# Patient Record
Sex: Female | Born: 1975 | Race: Black or African American | Hispanic: No | Smoking: Former smoker
Health system: Southern US, Community
[De-identification: ages and names within clinical notes are randomized; demographics above are authoritative.]

## PROBLEM LIST (undated history)

## (undated) DIAGNOSIS — F32A Depression, unspecified: Secondary | ICD-10-CM

## (undated) DIAGNOSIS — F329 Major depressive disorder, single episode, unspecified: Secondary | ICD-10-CM

## (undated) DIAGNOSIS — J45909 Unspecified asthma, uncomplicated: Secondary | ICD-10-CM

## (undated) DIAGNOSIS — I10 Essential (primary) hypertension: Secondary | ICD-10-CM

## (undated) DIAGNOSIS — E119 Type 2 diabetes mellitus without complications: Secondary | ICD-10-CM

## (undated) HISTORY — PX: SINUS EXPLORATION: SHX5214

## (undated) HISTORY — PX: KNEE ARTHROSCOPY: SUR90

## (undated) HISTORY — PX: CHOLECYSTECTOMY: SHX55

## (undated) HISTORY — PX: HERNIA REPAIR: SHX51

## (undated) HISTORY — PX: ANKLE ARTHROSCOPY: SUR85

---

## 2002-01-19 ENCOUNTER — Emergency Department (HOSPITAL_COMMUNITY): Admission: EM | Admit: 2002-01-19 | Discharge: 2002-01-19 | Payer: Self-pay | Admitting: Emergency Medicine

## 2012-06-22 ENCOUNTER — Encounter (HOSPITAL_COMMUNITY): Payer: Self-pay | Admitting: Emergency Medicine

## 2012-06-22 DIAGNOSIS — I10 Essential (primary) hypertension: Secondary | ICD-10-CM | POA: Insufficient documentation

## 2012-06-22 DIAGNOSIS — Z7982 Long term (current) use of aspirin: Secondary | ICD-10-CM | POA: Insufficient documentation

## 2012-06-22 DIAGNOSIS — Z881 Allergy status to other antibiotic agents status: Secondary | ICD-10-CM | POA: Insufficient documentation

## 2012-06-22 DIAGNOSIS — Z880 Allergy status to penicillin: Secondary | ICD-10-CM | POA: Insufficient documentation

## 2012-06-22 DIAGNOSIS — F3289 Other specified depressive episodes: Secondary | ICD-10-CM | POA: Insufficient documentation

## 2012-06-22 DIAGNOSIS — IMO0002 Reserved for concepts with insufficient information to code with codable children: Secondary | ICD-10-CM | POA: Insufficient documentation

## 2012-06-22 DIAGNOSIS — Z885 Allergy status to narcotic agent status: Secondary | ICD-10-CM | POA: Insufficient documentation

## 2012-06-22 DIAGNOSIS — Z79899 Other long term (current) drug therapy: Secondary | ICD-10-CM | POA: Insufficient documentation

## 2012-06-22 DIAGNOSIS — F329 Major depressive disorder, single episode, unspecified: Secondary | ICD-10-CM | POA: Insufficient documentation

## 2012-06-22 DIAGNOSIS — F17200 Nicotine dependence, unspecified, uncomplicated: Secondary | ICD-10-CM | POA: Insufficient documentation

## 2012-06-22 DIAGNOSIS — J45909 Unspecified asthma, uncomplicated: Secondary | ICD-10-CM | POA: Insufficient documentation

## 2012-06-22 DIAGNOSIS — Z794 Long term (current) use of insulin: Secondary | ICD-10-CM | POA: Insufficient documentation

## 2012-06-22 DIAGNOSIS — E119 Type 2 diabetes mellitus without complications: Secondary | ICD-10-CM | POA: Insufficient documentation

## 2012-06-22 LAB — POCT I-STAT, CHEM 8
BUN: 12 mg/dL (ref 6–23)
Calcium, Ion: 1.15 mmol/L (ref 1.12–1.23)
Creatinine, Ser: 0.8 mg/dL (ref 0.50–1.10)
Glucose, Bld: 300 mg/dL — ABNORMAL HIGH (ref 70–99)
TCO2: 25 mmol/L (ref 0–100)

## 2012-06-22 NOTE — ED Notes (Addendum)
Patient with left leg pain from hip down to foot.  Patient denies any injury to the area.  Patient has noted some swelling to the leg.  Patient is able to walk on leg.  Patient with long car ride yesterday after moving to [place].

## 2012-06-23 ENCOUNTER — Emergency Department (HOSPITAL_COMMUNITY)
Admission: EM | Admit: 2012-06-23 | Discharge: 2012-06-23 | Disposition: A | Discharge status: Home / Self Care | Payer: Medicaid Other | Attending: Emergency Medicine | Admitting: Emergency Medicine

## 2012-06-23 DIAGNOSIS — M5432 Sciatica, left side: Secondary | ICD-10-CM

## 2012-06-23 HISTORY — DX: Depression, unspecified: F32.A

## 2012-06-23 HISTORY — DX: Type 2 diabetes mellitus without complications: E11.9

## 2012-06-23 HISTORY — DX: Major depressive disorder, single episode, unspecified: F32.9

## 2012-06-23 HISTORY — DX: Unspecified asthma, uncomplicated: J45.909

## 2012-06-23 HISTORY — DX: Essential (primary) hypertension: I10

## 2012-06-23 MED ORDER — OXYCODONE-ACETAMINOPHEN 5-325 MG PO TABS
2.0000 | ORAL_TABLET | Freq: Once | ORAL | Status: AC
  Administered 2012-06-23: 2 via ORAL
  Filled 2012-06-23: qty 2

## 2012-06-23 MED ORDER — OXYCODONE-ACETAMINOPHEN 5-325 MG PO TABS: 2.0000 | ORAL_TABLET | ORAL | Status: DC | PRN

## 2012-06-23 NOTE — ED Notes (Signed)
Pt. C/o pain starting in left hip and radiating down leg. States pain is sharp with numbness/tingling. Pt. Cap refill instant, able to move toes. Denies hx of same symptoms.

## 2012-06-23 NOTE — ED Provider Notes (Signed)
History     CSN: [id number]  Arrival date & time 06/22/12  1940   First MD Initiated Contact with Patient 06/23/12 0112      Chief Complaint  Patient presents with  . Leg Pain    (Consider location/radiation/quality/duration/timing/severity/associated sxs/prior treatment) HPI 37 yo female presents to the ER with complaint of left lower extremity pain.  Pain starts in left lower back and extends down over the buttock down to foot.  No trauma, no prior h/o same.  Pain is described as burning and sharp, worse with flexion at the hip or with walking.  Pt new to the area, has appt with ortho tomorrow for knee pain.  Pt thinks left leg may be a bit more swollen than right.  Pt recently moved, but car ride to area no more than 2 hours with stops.  No prior h/o dvt or pe.  No foot drop, no weakness or numbness.  Past Medical History  Diagnosis Date  . Diabetes mellitus without complication   . Hypertension   . Asthma   . Depression     Past Surgical History  Procedure Laterality Date  . Cholecystectomy    . Hernia repair    . Knee arthroscopy    . Ankle arthroscopy    . Sinus exploration      History reviewed. No pertinent family history.  History  Substance Use Topics  . Smoking status: Current Every Day Smoker  . Smokeless tobacco: Not on file  . Alcohol Use: Yes     Comment: occ    OB History   Grav Para Term Preterm Abortions TAB SAB Ect Mult Living                  Review of Systems  All other systems reviewed and are negative.    Allergies  Codeine; Macrobid; and Penicillins  Home Medications   Current Outpatient Rx  Name  Route  Sig  Dispense  Refill  . amLODipine (NORVASC) 10 MG tablet   Oral   Take 10 mg by mouth daily.         Marland Kitchen aspirin 81 MG tablet   Oral   Take 81 mg by mouth daily.         . clopidogrel (PLAVIX) 75 MG tablet   Oral   Take 75 mg by mouth daily.         . colesevelam (WELCHOL) 625 MG tablet   Oral   Take 1,875 mg  by mouth 2 (two) times daily with a meal.         . enalapril-hydrochlorothiazide (VASERETIC) 10-25 MG per tablet   Oral   Take 1 tablet by mouth daily.         Marland Kitchen gabapentin (NEURONTIN) 100 MG capsule   Oral   Take 200 mg by mouth 3 (three) times daily.         Marland Kitchen ibuprofen (ADVIL,MOTRIN) 200 MG tablet   Oral   Take 800 mg by mouth every 6 (six) hours as needed for pain.         Marland Kitchen insulin aspart protamine- aspart (NOVOLOG 70/30) (70-30) 100 UNIT/ML injection   Subcutaneous   Inject 35 Units into the skin 2 (two) times daily.         . methocarbamol (ROBAXIN) 750 MG tablet   Oral   Take 750-1,500 mg by mouth 3 (three) times daily as needed (for muscle spasms/ pain).         Marland Kitchen  metoprolol tartrate (LOPRESSOR) 25 MG tablet   Oral   Take 25 mg by mouth 2 (two) times daily.         Marland Kitchen omeprazole (PRILOSEC) 40 MG capsule   Oral   Take 40 mg by mouth daily.         . ondansetron (ZOFRAN-ODT) 4 MG disintegrating tablet   Oral   Take 4 mg by mouth every 8 (eight) hours as needed for nausea.         Marland Kitchen oxyCODONE-acetaminophen (PERCOCET/ROXICET) 5-325 MG per tablet   Oral   Take 1 tablet by mouth every 8 (eight) hours as needed for pain.         . pravastatin (PRAVACHOL) 10 MG tablet   Oral   Take 10 mg by mouth daily.         . QUEtiapine (SEROQUEL) 200 MG tablet   Oral   Take 200 mg by mouth at bedtime.         . risperiDONE (RISPERDAL) 1 MG tablet   Oral   Take 1 mg by mouth daily.         Marland Kitchen oxyCODONE-acetaminophen (PERCOCET/ROXICET) 5-325 MG per tablet   Oral   Take 2 tablets by mouth every 4 (four) hours as needed for pain.   20 tablet   0     BP 139/94  Pulse 85  Temp(Src) 98.1 F (36.7 C) (Oral)  Resp 18  Ht 5\' 7"  (1.702 m)  Wt 262 lb (118.842 kg)  BMI 41.03 kg/m2  SpO2 99%  Physical Exam  Nursing note and vitals reviewed. Constitutional: She is oriented to person, place, and time. She appears well-developed and well-nourished.   Morbidly obese  HENT:  Head: Normocephalic and atraumatic.  Nose: Nose normal.  Mouth/Throat: Oropharynx is clear and moist.  Eyes: Conjunctivae and EOM are normal. Pupils are equal, round, and reactive to light.  Neck: Normal range of motion. Neck supple. No JVD present. No tracheal deviation present. No thyromegaly present.  Cardiovascular: Normal rate, regular rhythm, normal heart sounds and intact distal pulses.  Exam reveals no gallop and no friction rub.   No murmur heard. Pulmonary/Chest: Effort normal and breath sounds normal. No stridor. No respiratory distress. She has no wheezes. She has no rales. She exhibits no tenderness.  Abdominal: Soft. Bowel sounds are normal. She exhibits no distension and no mass. There is no tenderness. There is no rebound and no guarding.  Musculoskeletal: Normal range of motion. She exhibits no edema. Tenderness: SLR +on the left.  Lymphadenopathy:    She has no cervical adenopathy.  Neurological: She is alert and oriented to person, place, and time. She has normal reflexes. She exhibits normal muscle tone. Coordination normal.  Skin: Skin is warm and dry. No rash noted. No erythema. No pallor.  Psychiatric: She has a normal mood and affect. Her behavior is normal. Judgment and thought content normal.    ED Course  Procedures (including critical care time)  Labs Reviewed  POCT I-STAT, CHEM 8 - Abnormal; Notable for the following:    Glucose, Bld 300 (*)    All other components within normal limits  D-DIMER, QUANTITATIVE   No results found.   1. Sciatica, left       MDM  37 yo female with left back pain radiating down left leg, c/w sciatica.  She is already on neurontin, nsaids, robaxin.  She has run out of percocet will give short course of same.  Pt's labs show hyperglycemia,  pt informed of same.        Olivia Mackie, MD 06/23/12 2049

## 2012-07-01 ENCOUNTER — Emergency Department (HOSPITAL_COMMUNITY)
Admission: EM | Admit: 2012-07-01 | Discharge: 2012-07-01 | Disposition: A | Discharge status: Home / Self Care | Payer: Medicaid Other | Attending: Emergency Medicine | Admitting: Emergency Medicine

## 2012-07-01 ENCOUNTER — Encounter (HOSPITAL_COMMUNITY): Payer: Self-pay

## 2012-07-01 DIAGNOSIS — F3289 Other specified depressive episodes: Secondary | ICD-10-CM | POA: Insufficient documentation

## 2012-07-01 DIAGNOSIS — Z885 Allergy status to narcotic agent status: Secondary | ICD-10-CM | POA: Insufficient documentation

## 2012-07-01 DIAGNOSIS — J45909 Unspecified asthma, uncomplicated: Secondary | ICD-10-CM | POA: Insufficient documentation

## 2012-07-01 DIAGNOSIS — F17200 Nicotine dependence, unspecified, uncomplicated: Secondary | ICD-10-CM | POA: Insufficient documentation

## 2012-07-01 DIAGNOSIS — F329 Major depressive disorder, single episode, unspecified: Secondary | ICD-10-CM | POA: Insufficient documentation

## 2012-07-01 DIAGNOSIS — R209 Unspecified disturbances of skin sensation: Secondary | ICD-10-CM | POA: Insufficient documentation

## 2012-07-01 DIAGNOSIS — M545 Low back pain, unspecified: Secondary | ICD-10-CM | POA: Insufficient documentation

## 2012-07-01 DIAGNOSIS — Z7982 Long term (current) use of aspirin: Secondary | ICD-10-CM | POA: Insufficient documentation

## 2012-07-01 DIAGNOSIS — E119 Type 2 diabetes mellitus without complications: Secondary | ICD-10-CM | POA: Insufficient documentation

## 2012-07-01 DIAGNOSIS — I10 Essential (primary) hypertension: Secondary | ICD-10-CM | POA: Insufficient documentation

## 2012-07-01 DIAGNOSIS — G8929 Other chronic pain: Secondary | ICD-10-CM | POA: Insufficient documentation

## 2012-07-01 DIAGNOSIS — Z794 Long term (current) use of insulin: Secondary | ICD-10-CM | POA: Insufficient documentation

## 2012-07-01 DIAGNOSIS — M549 Dorsalgia, unspecified: Secondary | ICD-10-CM

## 2012-07-01 DIAGNOSIS — Z888 Allergy status to other drugs, medicaments and biological substances status: Secondary | ICD-10-CM | POA: Insufficient documentation

## 2012-07-01 DIAGNOSIS — Z880 Allergy status to penicillin: Secondary | ICD-10-CM | POA: Insufficient documentation

## 2012-07-01 DIAGNOSIS — Z79899 Other long term (current) drug therapy: Secondary | ICD-10-CM | POA: Insufficient documentation

## 2012-07-01 MED ORDER — OXYCODONE-ACETAMINOPHEN 5-325 MG PO TABS: 2.0000 | ORAL_TABLET | Freq: Four times a day (QID) | ORAL | Status: DC | PRN

## 2012-07-01 NOTE — Discharge Instructions (Signed)
Back Pain, Adult Low back pain is very common. About 1 in 5 people have back pain.The cause of low back pain is rarely dangerous. The pain often gets better over time.About half of people with a sudden onset of back pain feel better in just 2 weeks. About 8 in 10 people feel better by 6 weeks.  CAUSES Some common causes of back pain include:  Strain of the muscles or ligaments supporting the spine.  Wear and tear (degeneration) of the spinal discs.  Arthritis.  Direct injury to the back. DIAGNOSIS Most of the time, the direct cause of low back pain is not known.However, back pain can be treated effectively even when the exact cause of the pain is unknown.Answering your caregiver's questions about your overall health and symptoms is one of the most accurate ways to make sure the cause of your pain is not dangerous. If your caregiver needs more information, he or she may order lab work or imaging tests (X-rays or MRIs).However, even if imaging tests show changes in your back, this usually does not require surgery. HOME CARE INSTRUCTIONS For many people, back pain returns.Since low back pain is rarely dangerous, it is often a condition that people can learn to [facility] their own.   Remain active. It is stressful on the back to sit or stand in one place. Do not sit, drive, or stand in one place for more than 30 minutes at a time. Take short walks on level surfaces as soon as pain allows.Try to increase the length of time you walk each day.  Do not stay in bed.Resting more than 1 or 2 days can delay your recovery.  Do not avoid exercise or work.Your body is made to move.It is not dangerous to be active, even though your back may hurt.Your back will likely heal faster if you return to being active before your pain is gone.  Pay attention to your body when you bend and lift. Many people have less discomfortwhen lifting if they bend their knees, keep the load close to their bodies,and  avoid twisting. Often, the most comfortable positions are those that put less stress on your recovering back.  Find a comfortable position to sleep. Use a firm mattress and lie on your side with your knees slightly bent. If you lie on your back, put a pillow under your knees.  Only take over-the-counter or prescription medicines as directed by your caregiver. Over-the-counter medicines to reduce pain and inflammation are often the most helpful.Your caregiver may prescribe muscle relaxant drugs.These medicines help dull your pain so you can more quickly return to your normal activities and healthy exercise.  Put ice on the injured area.  Put ice in a plastic bag.  Place a towel between your skin and the bag.  Leave the ice on for 15-20 minutes, 3-4 times a day for the first 2 to 3 days. After that, ice and heat may be alternated to reduce pain and spasms.  Ask your caregiver about trying back exercises and gentle massage. This may be of some benefit.  Avoid feeling anxious or stressed.Stress increases muscle tension and can worsen back pain.It is important to recognize when you are anxious or stressed and learn ways to manage it.Exercise is a great option. SEEK MEDICAL CARE IF:  You have pain that is not relieved with rest or medicine.  You have pain that does not improve in 1 week.  You have new symptoms.  You are generally not feeling well. SEEK  IMMEDIATE MEDICAL CARE IF:   You have pain that radiates from your back into your legs.  You develop new bowel or bladder control problems.  You have unusual weakness or numbness in your arms or legs.  You develop nausea or vomiting.  You develop abdominal pain.  You feel faint. Document Released: 01/28/2005 Document Revised: 07/30/2011 Document Reviewed: 06/18/2010 [facility] Patient Information 2014 [place], [place]. Marland Kitcheneddchroni

## 2012-07-01 NOTE — ED Provider Notes (Signed)
History    This chart was scribed for non-physician practitioner, Roxy Horseman PA-C working with Vida Roller, MD by Donne Anon, ED Scribe. This patient was seen in room TR09C/TR09C and the patient's care was started at 1604.   CSN: [id number]  Arrival date & time 07/01/12  1525   First MD Initiated Contact with Patient 07/01/12 1604      Chief Complaint  Patient presents with  . Back Pain     The history is provided by the patient. No language interpreter was used.   HPI Comments: Destiny Meyer is a 37 y.o. female who presents to the Emergency Department complaining of 1 year of gradual onset, gradually worsening, constant, chronic lower back pain that radiates down her left leg. She reports associated numbness and tingling. She was seen in the ED on 06/22/12 for the same problem and states she has not gotten better since then. She states she was told by an orthopedist she has a pinched nerve but has to wait for a MRI before they can operate. She has tried Percocet, muscle relaxants, and physical therapy with little relief.   Past Medical History  Diagnosis Date  . Diabetes mellitus without complication   . Hypertension   . Asthma   . Depression     Past Surgical History  Procedure Laterality Date  . Cholecystectomy    . Hernia repair    . Knee arthroscopy    . Ankle arthroscopy    . Sinus exploration      History reviewed. No pertinent family history.  History  Substance Use Topics  . Smoking status: Current Every Day Smoker  . Smokeless tobacco: Not on file  . Alcohol Use: Yes     Comment: occ     Review of Systems  Musculoskeletal: Positive for back pain.  Neurological: Positive for numbness.  All other systems reviewed and are negative.    Allergies  Codeine; Macrobid; and Penicillins  Home Medications   Current Outpatient Rx  Name  Route  Sig  Dispense  Refill  . amLODipine (NORVASC) 10 MG tablet   Oral   Take 10 mg by mouth daily.        Marland Kitchen aspirin 81 MG tablet   Oral   Take 81 mg by mouth daily.         . clopidogrel (PLAVIX) 75 MG tablet   Oral   Take 75 mg by mouth daily.         . colesevelam (WELCHOL) 625 MG tablet   Oral   Take 1,875 mg by mouth 2 (two) times daily with a meal.         . enalapril-hydrochlorothiazide (VASERETIC) 10-25 MG per tablet   Oral   Take 1 tablet by mouth daily.         Marland Kitchen gabapentin (NEURONTIN) 100 MG capsule   Oral   Take 200 mg by mouth 3 (three) times daily.         Marland Kitchen ibuprofen (ADVIL,MOTRIN) 200 MG tablet   Oral   Take 800 mg by mouth every 6 (six) hours as needed for pain.         Marland Kitchen insulin aspart protamine- aspart (NOVOLOG 70/30) (70-30) 100 UNIT/ML injection   Subcutaneous   Inject 35 Units into the skin 2 (two) times daily.         . methocarbamol (ROBAXIN) 750 MG tablet   Oral   Take 750-1,500 mg by mouth 3 (three) times daily as  needed (for muscle spasms/ pain).         . metoprolol tartrate (LOPRESSOR) 25 MG tablet   Oral   Take 25 mg by mouth 2 (two) times daily.         Marland Kitchen omeprazole (PRILOSEC) 40 MG capsule   Oral   Take 40 mg by mouth daily.         . ondansetron (ZOFRAN-ODT) 4 MG disintegrating tablet   Oral   Take 4 mg by mouth every 8 (eight) hours as needed for nausea.         Marland Kitchen oxyCODONE-acetaminophen (PERCOCET/ROXICET) 5-325 MG per tablet   Oral   Take 1 tablet by mouth every 8 (eight) hours as needed for pain.         Marland Kitchen oxyCODONE-acetaminophen (PERCOCET/ROXICET) 5-325 MG per tablet   Oral   Take 2 tablets by mouth every 4 (four) hours as needed for pain.   20 tablet   0   . pravastatin (PRAVACHOL) 10 MG tablet   Oral   Take 10 mg by mouth daily.         . QUEtiapine (SEROQUEL) 200 MG tablet   Oral   Take 200 mg by mouth at bedtime.         . risperiDONE (RISPERDAL) 1 MG tablet   Oral   Take 1 mg by mouth daily.           BP 136/100  Pulse 92  Resp 18  SpO2 94%  Physical Exam  Nursing note and  vitals reviewed. Constitutional: She is oriented to person, place, and time. She appears well-developed and well-nourished. No distress.  HENT:  Head: Normocephalic and atraumatic.  Eyes: Conjunctivae and EOM are normal. Right eye exhibits no discharge. Left eye exhibits no discharge. No scleral icterus.  Neck: Normal range of motion. Neck supple. No tracheal deviation present.  Cardiovascular: Normal rate, regular rhythm and normal heart sounds.  Exam reveals no gallop and no friction rub.   No murmur heard. Pulmonary/Chest: Effort normal and breath sounds normal. No respiratory distress. She has no wheezes.  Abdominal: Soft. She exhibits no distension. There is no tenderness.  Musculoskeletal: Normal range of motion.  Lumbar paraspinal muscles tender to palpation, no bony tenderness, step-offs, or gross abnormality or deformity of spine, patient is able to ambulate, moves all extremities  Neurological: She is alert and oriented to person, place, and time.  Sensation and strength intact bilaterally  Skin: Skin is warm and dry. She is not diaphoretic.  Psychiatric: She has a normal mood and affect. Her behavior is normal. Judgment and thought content normal.    ED Course  Procedures (including critical care time) DIAGNOSTIC STUDIES: Oxygen Saturation is 94% on room air, low by my interpretation.    COORDINATION OF CARE: 5:07 PM Discussed treatment plan which includes pain management with pt at bedside and pt agreed to plan. Advised pt to call [facility] and send her old MRI to the orthopedist and ask him to review that. Advised [facility].    Labs Reviewed - No data to display No results found.   1. Chronic back pain       MDM  Patient with back pain.  No neurological deficits and normal neuro exam.  Patient can walk but states is painful.  No loss of bowel or bladder control.  No concern for cauda equina.  No fever, night sweats, weight loss, h/o  cancer, IVDU.  RICE protocol  and pain medicine indicated and discussed with patient.   I personally performed the services described in this documentation, which was scribed in my presence. The recorded information has been reviewed and is accurate.         Roxy Horseman, PA-C 07/02/12 0020

## 2012-07-01 NOTE — ED Notes (Signed)
Pt c/o bilateral lower back pain that moves down left leg x 1 year

## 2012-07-01 NOTE — ED Notes (Signed)
Pt recently moved here. C/o lower back pain x 1 year that goes down left leg and in left shoulder. Reports that she has consulted orthopedics and physical therapy with no relief.

## 2012-07-01 NOTE — ED Notes (Signed)
PA at bedside.

## 2012-07-03 NOTE — ED Provider Notes (Signed)
Medical screening examination/treatment/procedure(s) were performed by non-physician practitioner and as supervising physician I was immediately available for consultation/collaboration.    Vida Roller, MD 07/03/12 [phone]

## 2012-07-10 ENCOUNTER — Ambulatory Visit: Payer: Medicaid Other | Admitting: Family Medicine

## 2012-07-13 ENCOUNTER — Ambulatory Visit: Payer: Medicaid Other

## 2012-07-21 ENCOUNTER — Encounter (HOSPITAL_COMMUNITY): Payer: Self-pay | Admitting: Emergency Medicine

## 2012-07-21 ENCOUNTER — Emergency Department (HOSPITAL_COMMUNITY)
Admission: EM | Admit: 2012-07-21 | Discharge: 2012-07-21 | Disposition: A | Discharge status: Home / Self Care | Payer: Medicaid Other | Attending: Emergency Medicine | Admitting: Emergency Medicine

## 2012-07-21 ENCOUNTER — Emergency Department (HOSPITAL_COMMUNITY): Payer: Medicaid Other

## 2012-07-21 DIAGNOSIS — Z7982 Long term (current) use of aspirin: Secondary | ICD-10-CM | POA: Insufficient documentation

## 2012-07-21 DIAGNOSIS — R21 Rash and other nonspecific skin eruption: Secondary | ICD-10-CM | POA: Insufficient documentation

## 2012-07-21 DIAGNOSIS — Z3202 Encounter for pregnancy test, result negative: Secondary | ICD-10-CM | POA: Insufficient documentation

## 2012-07-21 DIAGNOSIS — F3289 Other specified depressive episodes: Secondary | ICD-10-CM | POA: Insufficient documentation

## 2012-07-21 DIAGNOSIS — N390 Urinary tract infection, site not specified: Secondary | ICD-10-CM | POA: Insufficient documentation

## 2012-07-21 DIAGNOSIS — F329 Major depressive disorder, single episode, unspecified: Secondary | ICD-10-CM | POA: Insufficient documentation

## 2012-07-21 DIAGNOSIS — Z794 Long term (current) use of insulin: Secondary | ICD-10-CM | POA: Insufficient documentation

## 2012-07-21 DIAGNOSIS — Z880 Allergy status to penicillin: Secondary | ICD-10-CM | POA: Insufficient documentation

## 2012-07-21 DIAGNOSIS — L299 Pruritus, unspecified: Secondary | ICD-10-CM | POA: Insufficient documentation

## 2012-07-21 DIAGNOSIS — Z79899 Other long term (current) drug therapy: Secondary | ICD-10-CM | POA: Insufficient documentation

## 2012-07-21 DIAGNOSIS — E119 Type 2 diabetes mellitus without complications: Secondary | ICD-10-CM | POA: Insufficient documentation

## 2012-07-21 DIAGNOSIS — J45909 Unspecified asthma, uncomplicated: Secondary | ICD-10-CM | POA: Insufficient documentation

## 2012-07-21 DIAGNOSIS — Z87891 Personal history of nicotine dependence: Secondary | ICD-10-CM | POA: Insufficient documentation

## 2012-07-21 DIAGNOSIS — I10 Essential (primary) hypertension: Secondary | ICD-10-CM | POA: Insufficient documentation

## 2012-07-21 DIAGNOSIS — R1033 Periumbilical pain: Secondary | ICD-10-CM | POA: Insufficient documentation

## 2012-07-21 LAB — CBC WITH DIFFERENTIAL/PLATELET
Eosinophils Absolute: 0.1 10*3/uL (ref 0.0–0.7)
HCT: 43.6 % (ref 36.0–46.0)
Hemoglobin: 15.1 g/dL — ABNORMAL HIGH (ref 12.0–15.0)
Lymphs Abs: 2.4 10*3/uL (ref 0.7–4.0)
MCH: 29.3 pg (ref 26.0–34.0)
Monocytes Absolute: 0.6 10*3/uL (ref 0.1–1.0)
Monocytes Relative: 8 % (ref 3–12)
Neutro Abs: 4.8 10*3/uL (ref 1.7–7.7)
Neutrophils Relative %: 60 % (ref 43–77)
RBC: 5.16 MIL/uL — ABNORMAL HIGH (ref 3.87–5.11)

## 2012-07-21 LAB — URINALYSIS, ROUTINE W REFLEX MICROSCOPIC
Bilirubin Urine: NEGATIVE
Glucose, UA: >1000 mg/dL — AB
Hgb urine dipstick: NEGATIVE
Protein, ur: NEGATIVE mg/dL

## 2012-07-21 LAB — POCT PREGNANCY, URINE: Preg Test, Ur: NEGATIVE

## 2012-07-21 LAB — COMPREHENSIVE METABOLIC PANEL
Alkaline Phosphatase: 83 U/L (ref 39–117)
BUN: 9 mg/dL (ref 6–23)
Chloride: 96 mEq/L (ref 96–112)
GFR calc Af Amer: >90 mL/min (ref >90)
Glucose, Bld: 312 mg/dL — ABNORMAL HIGH (ref 70–99)
Potassium: 3.8 mEq/L (ref 3.5–5.1)
Total Bilirubin: 0.7 mg/dL (ref 0.3–1.2)

## 2012-07-21 LAB — LIPASE, BLOOD: Lipase: 25 U/L (ref 11–59)

## 2012-07-21 MED ORDER — CIPROFLOXACIN HCL 500 MG PO TABS
500.0000 mg | ORAL_TABLET | Freq: Once | ORAL | Status: AC
  Administered 2012-07-21: 500 mg via ORAL
  Filled 2012-07-21: qty 1

## 2012-07-21 MED ORDER — IOHEXOL 300 MG/ML  SOLN
50.0000 mL | Freq: Once | INTRAMUSCULAR | Status: AC | PRN
  Administered 2012-07-21: 50 mL via ORAL

## 2012-07-21 MED ORDER — HYDROCODONE-ACETAMINOPHEN 5-325 MG PO TABS: 2.0000 | ORAL_TABLET | ORAL | Status: DC | PRN

## 2012-07-21 MED ORDER — IOHEXOL 300 MG/ML  SOLN
100.0000 mL | Freq: Once | INTRAMUSCULAR | Status: AC | PRN
  Administered 2012-07-21: 100 mL via INTRAVENOUS

## 2012-07-21 MED ORDER — MORPHINE SULFATE 4 MG/ML IJ SOLN
2.0000 mg | Freq: Once | INTRAMUSCULAR | Status: AC
  Administered 2012-07-21: 2 mg via INTRAVENOUS
  Filled 2012-07-21: qty 1

## 2012-07-21 MED ORDER — SODIUM CHLORIDE 0.9 % IV SOLN
Freq: Once | INTRAVENOUS | Status: AC
  Administered 2012-07-21: 14:00:00 via INTRAVENOUS

## 2012-07-21 MED ORDER — DIPHENHYDRAMINE HCL 50 MG/ML IJ SOLN
25.0000 mg | Freq: Once | INTRAMUSCULAR | Status: AC
  Administered 2012-07-21: 25 mg via INTRAVENOUS
  Filled 2012-07-21: qty 1

## 2012-07-21 MED ORDER — CIPROFLOXACIN HCL 500 MG PO TABS: 500.0000 mg | ORAL_TABLET | Freq: Two times a day (BID) | ORAL | Status: DC

## 2012-07-21 MED ORDER — ONDANSETRON HCL 4 MG PO TABS: 4.0000 mg | ORAL_TABLET | Freq: Four times a day (QID) | ORAL | Status: DC

## 2012-07-21 MED ORDER — DIPHENHYDRAMINE HCL 25 MG PO TABS: 25.0000 mg | ORAL_TABLET | Freq: Four times a day (QID) | ORAL | Status: AC | PRN

## 2012-07-21 NOTE — ED Notes (Addendum)
Pt reports abdominal pain since Sunday at the navel level at 9/10. Positive for nausea and diarrhea. Last BM yesterday. Denies vomiting. Reports increase urinary frequency but denies burning.  Pt also reports itching on arms, legs and buttocks.

## 2012-07-21 NOTE — ED Provider Notes (Signed)
History     CSN: [id number]  Arrival date & time 07/21/12  1223   First MD Initiated Contact with Patient 07/21/12 1226      Chief Complaint  Patient presents with  . Abdominal Pain  . Pruritis    (Consider location/radiation/quality/duration/timing/severity/associated sxs/prior treatment) HPI  Patient is a 37 yo F PMHx significant for DM, HTN, s/p umbilical hernia repair (2009), s/p cholecystectomy (2010) presenting to the ED for sharp constant non-radiating umbilical pain that began Sunday w/o provoking factor. Patient rates pain 8/10. No alleviating factors. Aggravating factors include movement and deep breaths. Pt has also noted pruritic skin since Sunday. Denies fevers, chills, nausea, vomiting, diarrhea, constipation, CP, SOB.   Past Medical History  Diagnosis Date  . Diabetes mellitus without complication   . Hypertension   . Asthma   . Depression     Past Surgical History  Procedure Laterality Date  . Cholecystectomy    . Hernia repair    . Knee arthroscopy    . Ankle arthroscopy    . Sinus exploration      No family history on file.  History  Substance Use Topics  . Smoking status: Former Games developer  . Smokeless tobacco: Not on file  . Alcohol Use: Yes     Comment: occ    OB History   Grav Para Term Preterm Abortions TAB SAB Ect Mult Living                  Review of Systems  Constitutional: Negative for fever and chills.  Respiratory: Negative for shortness of breath.   Cardiovascular: Negative for chest pain.  Gastrointestinal: Positive for abdominal pain. Negative for nausea, vomiting, diarrhea, constipation and blood in stool.  Skin: Positive for rash.  All other systems reviewed and are negative.    Allergies  Codeine; Macrobid; and Penicillins  Home Medications   Current Outpatient Rx  Name  Route  Sig  Dispense  Refill  . amLODipine (NORVASC) 10 MG tablet   Oral   Take 10 mg by mouth daily.         Marland Kitchen aspirin 81 MG tablet    Oral   Take 81 mg by mouth daily.         . clopidogrel (PLAVIX) 75 MG tablet   Oral   Take 75 mg by mouth daily.         . colesevelam (WELCHOL) 625 MG tablet   Oral   Take 1,875 mg by mouth 2 (two) times daily with a meal.         . enalapril-hydrochlorothiazide (VASERETIC) 10-25 MG per tablet   Oral   Take 1 tablet by mouth daily.         Marland Kitchen gabapentin (NEURONTIN) 100 MG capsule   Oral   Take 200 mg by mouth 3 (three) times daily.         Marland Kitchen ibuprofen (ADVIL,MOTRIN) 200 MG tablet   Oral   Take 800 mg by mouth every 6 (six) hours as needed for pain.         Marland Kitchen insulin aspart protamine- aspart (NOVOLOG 70/30) (70-30) 100 UNIT/ML injection   Subcutaneous   Inject 35 Units into the skin 2 (two) times daily.         . methocarbamol (ROBAXIN) 750 MG tablet   Oral   Take 750-1,500 mg by mouth 3 (three) times daily as needed (for muscle spasms/ pain).         . metoprolol tartrate (  LOPRESSOR) 25 MG tablet   Oral   Take 25 mg by mouth 2 (two) times daily.         Marland Kitchen omeprazole (PRILOSEC) 40 MG capsule   Oral   Take 40 mg by mouth daily.         . ondansetron (ZOFRAN-ODT) 4 MG disintegrating tablet   Oral   Take 4 mg by mouth every 8 (eight) hours as needed for nausea.         Marland Kitchen oxyCODONE-acetaminophen (PERCOCET/ROXICET) 5-325 MG per tablet   Oral   Take 1 tablet by mouth every 8 (eight) hours as needed for pain.         . pravastatin (PRAVACHOL) 10 MG tablet   Oral   Take 10 mg by mouth daily.         . QUEtiapine (SEROQUEL) 200 MG tablet   Oral   Take 200 mg by mouth at bedtime.         . risperiDONE (RISPERDAL) 1 MG tablet   Oral   Take 1 mg by mouth daily.         . ciprofloxacin (CIPRO) 500 MG tablet   Oral   Take 1 tablet (500 mg total) by mouth 2 (two) times daily.   6 tablet   0   . diphenhydrAMINE (BENADRYL) 25 MG tablet   Oral   Take 1 tablet (25 mg total) by mouth every 6 (six) hours as needed for itching (Rash).   30  tablet   0   . HYDROcodone-acetaminophen (NORCO/VICODIN) 5-325 MG per tablet   Oral   Take 2 tablets by mouth every 4 (four) hours as needed for pain.   12 tablet   0   . ondansetron (ZOFRAN) 4 MG tablet   Oral   Take 1 tablet (4 mg total) by mouth every 6 (six) hours.   12 tablet   0     BP 131/87  Pulse 80  Temp(Src) 97.8 F (36.6 C) (Oral)  Resp 16  SpO2 99%  LMP 07/09/2012  Physical Exam  Constitutional: She is oriented to person, place, and time. She appears well-developed and well-nourished.  HENT:  Head: Normocephalic and atraumatic.  Eyes: EOM are normal. Pupils are equal, round, and reactive to light.  Neck: Neck supple.  Cardiovascular: Normal rate, regular rhythm and normal heart sounds.   Pulmonary/Chest: Effort normal and breath sounds normal.  Abdominal: Soft. Bowel sounds are normal. There is tenderness in the right upper quadrant, periumbilical area and left upper quadrant.  Neurological: She is alert and oriented to person, place, and time.  Skin: Skin is warm, dry and intact. No rash noted. She is not diaphoretic.  Psychiatric: She has a normal mood and affect.    ED Course  Procedures (including critical care time)  Labs Reviewed  URINALYSIS, ROUTINE W REFLEX MICROSCOPIC - Abnormal; Notable for the following:    APPearance CLOUDY (*)    Specific Gravity, Urine 1.033 (*)    Glucose, UA >1000 (*)    All other components within normal limits  GLUCOSE, CAPILLARY - Abnormal; Notable for the following:    Glucose-Capillary 319 (*)    All other components within normal limits  CBC WITH DIFFERENTIAL - Abnormal; Notable for the following:    RBC 5.16 (*)    Hemoglobin 15.1 (*)    All other components within normal limits  COMPREHENSIVE METABOLIC PANEL - Abnormal; Notable for the following:    Sodium 134 (*)  Glucose, Bld 312 (*)    All other components within normal limits  URINE MICROSCOPIC-ADD ON - Abnormal; Notable for the following:     Squamous Epithelial / LPF FEW (*)    Bacteria, UA FEW (*)    All other components within normal limits  LIPASE, BLOOD  POCT PREGNANCY, URINE   Ct Abdomen Pelvis W Contrast  07/21/2012   *RADIOLOGY REPORT*  Clinical Data: Abdominal and pelvic pain, history of hernia repair and cholecystectomy  CT ABDOMEN AND PELVIS WITH CONTRAST  Technique:  Multidetector CT imaging of the abdomen and pelvis was performed following the standard protocol during bolus administration of intravenous contrast.  Contrast: OMNIPAQUE IOHEXOL 300 MG/ML  SOLN  Comparison: None.  Findings: Minimal dependent basilar atelectasis.  No lower lobe pneumonia.  No pericardial or pleural effusion.  Normal heart size.  Abdomen:  Prior cholecystectomy evident.  No focal hepatic abnormality.  Patent portal vein.  No biliary dilatation.  Biliary system, pancreas, and adrenal glands are within normal limits for age and demonstrate no acute process.  Minor cortical thinning of the left kidney otherwise no acute renal process.  No hydronephrosis or obstruction.  No obstructing ureteral calculus.  Spleen demonstrates a small subcapsular hypodense 10 mm cyst posteriorly, image 16 but is confirmed on the sagittal and coronal reconstructions.  No other splenic abnormality.  No abdominal free fluid, fluid collection, hemorrhage, abscess, or adenopathy.  Negative for bowel obstruction, dilatation, ileus, or free air.  Transverse colon is collapsed.  Normal appendix demonstrated.  Postop changes of the umbilical area.  No recurrent abdominal wall or inguinal hernia demonstrated.  Pelvis:  Moderate retained stool in the distal colon.  Urinary bladder unremarkable.  Uterus and adnexa normal in size.  Symmetric ovaries.  No pelvic free fluid, fluid collection, hemorrhage, abscess, adenopathy, or inguinal abnormality.  No acute or abnormal osseous finding.  IMPRESSION: Prior cholecystectomy.  Incidental splenic cyst measuring 10 mm.  No recurrent abdominal  wall or ventral hernia.  Normal appendix  Moderate retained stool in the distal colon  No acute intra-abdominal or pelvic process demonstrated.   Original Report Authenticated By: Judie Petit. Shick, M.D.     1. UTI (urinary tract infection)   2. Abdominal pain, acute, periumbilical       MDM  Patient is nontoxic, nonseptic appearing, in no apparent distress.  Patient's pain and other symptoms adequately managed in emergency department.  Fluid bolus given.  Labs, imaging and vitals reviewed.  Patient does not meet the SIRS or Sepsis criteria.  On repeat exam patient does not have a surgical abdomin and there are nor peritoneal signs.  No indication of appendicitis, bowel obstruction, bowel perforation, cholecystitis, diverticulitis or ectopic pregnancy.  Patient discharged home with symptomatic treatment and given strict instructions for follow-up with their primary care physician.  I have also discussed reasons to return immediately to the ER. Follow up advised for re-evaluation of asymptomatic splenic cyst. Patient expresses understanding and agrees with plan. Patient d/w with Dr. Rubin Payor, agrees with plan. Patient is stable at time of discharge.             Jeannetta Ellis, PA-C 07/21/12 2022

## 2012-07-21 NOTE — ED Notes (Signed)
Patient transported to CT 

## 2012-07-23 NOTE — ED Provider Notes (Signed)
Medical screening examination/treatment/procedure(s) were performed by non-physician practitioner and as supervising physician I was immediately available for consultation/collaboration.  Juliet Rude. Rubin Payor, MD 07/23/12 1358

## 2012-07-30 ENCOUNTER — Encounter (HOSPITAL_COMMUNITY): Payer: Self-pay

## 2012-07-30 ENCOUNTER — Observation Stay (HOSPITAL_COMMUNITY)
Admission: AD | Admit: 2012-07-30 | Discharge: 2012-08-03 | Disposition: A | Discharge status: Home / Self Care | Payer: Medicaid Other | Source: Ambulatory Visit | Attending: Internal Medicine | Admitting: Internal Medicine

## 2012-07-30 ENCOUNTER — Observation Stay (HOSPITAL_COMMUNITY): Payer: Medicaid Other

## 2012-07-30 DIAGNOSIS — Z79899 Other long term (current) drug therapy: Secondary | ICD-10-CM | POA: Insufficient documentation

## 2012-07-30 DIAGNOSIS — R110 Nausea: Secondary | ICD-10-CM | POA: Insufficient documentation

## 2012-07-30 DIAGNOSIS — I2510 Atherosclerotic heart disease of native coronary artery without angina pectoris: Secondary | ICD-10-CM | POA: Insufficient documentation

## 2012-07-30 DIAGNOSIS — R1033 Periumbilical pain: Principal | ICD-10-CM | POA: Insufficient documentation

## 2012-07-30 DIAGNOSIS — K5900 Constipation, unspecified: Secondary | ICD-10-CM | POA: Insufficient documentation

## 2012-07-30 DIAGNOSIS — R109 Unspecified abdominal pain: Secondary | ICD-10-CM | POA: Diagnosis present

## 2012-07-30 DIAGNOSIS — I10 Essential (primary) hypertension: Secondary | ICD-10-CM | POA: Insufficient documentation

## 2012-07-30 DIAGNOSIS — E1165 Type 2 diabetes mellitus with hyperglycemia: Secondary | ICD-10-CM

## 2012-07-30 DIAGNOSIS — IMO0001 Reserved for inherently not codable concepts without codable children: Secondary | ICD-10-CM | POA: Insufficient documentation

## 2012-07-30 LAB — CBC
HCT: 40.3 % (ref 36.0–46.0)
Hemoglobin: 13.8 g/dL (ref 12.0–15.0)
MCH: 29 pg (ref 26.0–34.0)
RBC: 4.76 MIL/uL (ref 3.87–5.11)

## 2012-07-30 LAB — COMPREHENSIVE METABOLIC PANEL
ALT: 15 U/L (ref 0–35)
Alkaline Phosphatase: 85 U/L (ref 39–117)
CO2: 25 mEq/L (ref 19–32)
Calcium: 9.1 mg/dL (ref 8.4–10.5)
GFR calc Af Amer: >90 mL/min (ref >90)
GFR calc non Af Amer: 82 mL/min — ABNORMAL LOW (ref >90)
Glucose, Bld: 359 mg/dL — ABNORMAL HIGH (ref 70–99)
Potassium: 3.9 mEq/L (ref 3.5–5.1)
Sodium: 137 mEq/L (ref 135–145)

## 2012-07-30 MED ORDER — SIMVASTATIN 5 MG PO TABS
5.0000 mg | ORAL_TABLET | Freq: Every day | ORAL | Status: DC
  Administered 2012-07-30 – 2012-08-02 (×4): 5 mg via ORAL
  Filled 2012-07-30 (×5): qty 1

## 2012-07-30 MED ORDER — ONDANSETRON HCL 4 MG PO TABS
4.0000 mg | ORAL_TABLET | Freq: Four times a day (QID) | ORAL | Status: DC | PRN
  Administered 2012-07-31 (×2): 4 mg via ORAL
  Filled 2012-07-30 (×2): qty 1

## 2012-07-30 MED ORDER — ASPIRIN EC 81 MG PO TBEC
81.0000 mg | DELAYED_RELEASE_TABLET | Freq: Every day | ORAL | Status: DC
  Administered 2012-07-31 – 2012-08-03 (×4): 81 mg via ORAL
  Filled 2012-07-30 (×4): qty 1

## 2012-07-30 MED ORDER — INSULIN ASPART 100 UNIT/ML ~~LOC~~ SOLN
0.0000 [IU] | Freq: Three times a day (TID) | SUBCUTANEOUS | Status: DC
  Administered 2012-07-30: 15 [IU] via SUBCUTANEOUS
  Administered 2012-07-31: 3 [IU] via SUBCUTANEOUS
  Administered 2012-07-31 (×2): 5 [IU] via SUBCUTANEOUS
  Administered 2012-08-01: 3 [IU] via SUBCUTANEOUS
  Administered 2012-08-01: 2 [IU] via SUBCUTANEOUS
  Administered 2012-08-02 (×2): 3 [IU] via SUBCUTANEOUS
  Administered 2012-08-03: 5 [IU] via SUBCUTANEOUS

## 2012-07-30 MED ORDER — OXYCODONE-ACETAMINOPHEN 5-325 MG PO TABS
1.0000 | ORAL_TABLET | Freq: Three times a day (TID) | ORAL | Status: DC | PRN
  Administered 2012-07-30: 1 via ORAL
  Filled 2012-07-30: qty 1

## 2012-07-30 MED ORDER — QUETIAPINE FUMARATE 200 MG PO TABS
200.0000 mg | ORAL_TABLET | Freq: Every day | ORAL | Status: DC
  Administered 2012-07-30 – 2012-08-02 (×4): 200 mg via ORAL
  Filled 2012-07-30 (×5): qty 1

## 2012-07-30 MED ORDER — ENALAPRIL MALEATE 10 MG PO TABS
10.0000 mg | ORAL_TABLET | Freq: Every day | ORAL | Status: DC
  Administered 2012-07-31 – 2012-08-03 (×4): 10 mg via ORAL
  Filled 2012-07-30 (×4): qty 1

## 2012-07-30 MED ORDER — RISPERIDONE 1 MG PO TABS
1.0000 mg | ORAL_TABLET | Freq: Every day | ORAL | Status: DC
  Administered 2012-07-30 – 2012-08-03 (×5): 1 mg via ORAL
  Filled 2012-07-30 (×5): qty 1

## 2012-07-30 MED ORDER — ENALAPRIL-HYDROCHLOROTHIAZIDE 10-25 MG PO TABS: 1.0000 | ORAL_TABLET | Freq: Every day | ORAL | Status: DC

## 2012-07-30 MED ORDER — AMLODIPINE BESYLATE 10 MG PO TABS
10.0000 mg | ORAL_TABLET | Freq: Every day | ORAL | Status: DC
  Administered 2012-07-31 – 2012-08-03 (×4): 10 mg via ORAL
  Filled 2012-07-30 (×4): qty 1

## 2012-07-30 MED ORDER — CLOPIDOGREL BISULFATE 75 MG PO TABS
75.0000 mg | ORAL_TABLET | Freq: Every day | ORAL | Status: DC
  Administered 2012-07-31 – 2012-08-03 (×4): 75 mg via ORAL
  Filled 2012-07-30 (×4): qty 1

## 2012-07-30 MED ORDER — SODIUM CHLORIDE 0.9 % IV SOLN
INTRAVENOUS | Status: DC
Start: 2012-07-30 — End: 2012-08-01
  Administered 2012-07-30 – 2012-08-01 (×4): via INTRAVENOUS

## 2012-07-30 MED ORDER — COLESEVELAM HCL 625 MG PO TABS
1875.0000 mg | ORAL_TABLET | Freq: Two times a day (BID) | ORAL | Status: DC
  Administered 2012-07-30 – 2012-08-03 (×8): 1875 mg via ORAL
  Filled 2012-07-30 (×10): qty 3

## 2012-07-30 MED ORDER — OXYCODONE HCL 5 MG PO TABS
5.0000 mg | ORAL_TABLET | ORAL | Status: DC | PRN
  Administered 2012-07-30 – 2012-08-03 (×9): 5 mg via ORAL
  Filled 2012-07-30 (×9): qty 1

## 2012-07-30 MED ORDER — POLYETHYLENE GLYCOL 3350 17 G PO PACK
17.0000 g | PACK | Freq: Every day | ORAL | Status: DC
  Administered 2012-07-30 – 2012-08-03 (×5): 17 g via ORAL
  Filled 2012-07-30 (×5): qty 1

## 2012-07-30 MED ORDER — ENOXAPARIN SODIUM 40 MG/0.4ML ~~LOC~~ SOLN
40.0000 mg | SUBCUTANEOUS | Status: DC
  Administered 2012-07-30 – 2012-08-02 (×4): 40 mg via SUBCUTANEOUS
  Filled 2012-07-30 (×5): qty 0.4

## 2012-07-30 MED ORDER — BISACODYL 10 MG RE SUPP
10.0000 mg | Freq: Every day | RECTAL | Status: DC | PRN
  Administered 2012-07-31 – 2012-08-01 (×2): 10 mg via RECTAL
  Filled 2012-07-30 (×2): qty 1

## 2012-07-30 MED ORDER — OXYCODONE-ACETAMINOPHEN 5-325 MG PO TABS
1.0000 | ORAL_TABLET | ORAL | Status: DC | PRN
  Administered 2012-07-30 – 2012-08-01 (×3): 1 via ORAL
  Filled 2012-07-30 (×3): qty 1

## 2012-07-30 MED ORDER — HYDROCHLOROTHIAZIDE 25 MG PO TABS
25.0000 mg | ORAL_TABLET | Freq: Every day | ORAL | Status: DC
  Administered 2012-07-31 – 2012-08-03 (×4): 25 mg via ORAL
  Filled 2012-07-30 (×4): qty 1

## 2012-07-30 MED ORDER — ONDANSETRON HCL 4 MG/2ML IJ SOLN: 4.0000 mg | Freq: Four times a day (QID) | INTRAMUSCULAR | Status: DC | PRN

## 2012-07-30 MED ORDER — METOPROLOL TARTRATE 25 MG PO TABS
25.0000 mg | ORAL_TABLET | Freq: Two times a day (BID) | ORAL | Status: DC
  Administered 2012-07-30 – 2012-08-03 (×8): 25 mg via ORAL
  Filled 2012-07-30 (×9): qty 1

## 2012-07-30 MED ORDER — GABAPENTIN 100 MG PO CAPS
200.0000 mg | ORAL_CAPSULE | Freq: Three times a day (TID) | ORAL | Status: DC
  Administered 2012-07-30 – 2012-08-03 (×13): 200 mg via ORAL
  Filled 2012-07-30 (×14): qty 2

## 2012-07-30 MED ORDER — INSULIN ASPART PROT & ASPART (70-30 MIX) 100 UNIT/ML ~~LOC~~ SUSP
35.0000 [IU] | Freq: Two times a day (BID) | SUBCUTANEOUS | Status: DC
  Administered 2012-07-30 – 2012-08-03 (×8): 35 [IU] via SUBCUTANEOUS
  Filled 2012-07-30: qty 10

## 2012-07-30 NOTE — H&P (Signed)
PCP:   Jackie Plum, MD   Chief Complaint:  Abdominal pain  HPI: 37 yr old female with h/o CAD, HTN, DM, Asthma was sent from PCP office for abdominal pain. Patient also has been having nausea for past one week. Patient says the pain is located on the umbilical region, and comes off and on. She had CT abdomen done on 6/10 which showed 10 mm splenic cyst, and no other significant abnormality. Patient says that she has  poor appetite.she denies chest pain, admits to  shortness of breath. Patient was treated with po Cipro for UTI.  Allergies:   Allergies  Allergen Reactions  . Eggs Or Egg-Derived Products Hives  . Levaquin (Levofloxacin In D5w) Nausea And Vomiting  . Codeine Itching and Rash  . Macrobid (Nitrofurantoin Macrocrystal) Itching and Rash  . Penicillins Itching and Rash      Past Medical History  Diagnosis Date  . Diabetes mellitus without complication   . Hypertension   . Asthma   . Depression     Past Surgical History  Procedure Laterality Date  . Cholecystectomy    . Hernia repair    . Knee arthroscopy    . Ankle arthroscopy    . Sinus exploration      Prior to Admission medications   Medication Sig Start Date End Date Taking? Authorizing Provider  amLODipine (NORVASC) 10 MG tablet Take 10 mg by mouth every morning.    Yes Historical Provider, MD  aspirin 81 MG tablet Take 81 mg by mouth every morning.    Yes Historical Provider, MD  clopidogrel (PLAVIX) 75 MG tablet Take 75 mg by mouth every morning.    Yes Historical Provider, MD  colesevelam (WELCHOL) 625 MG tablet Take 625 mg by mouth 2 (two) times daily with a meal.    Yes Historical Provider, MD  diphenhydrAMINE (BENADRYL) 25 MG tablet Take 1 tablet (25 mg total) by mouth every 6 (six) hours as needed for itching (Rash). 07/21/12  Yes Jennifer L Piepenbrink, PA-C  enalapril-hydrochlorothiazide (VASERETIC) 10-25 MG per tablet Take 1 tablet by mouth every morning.    Yes Historical Provider, MD   gabapentin (NEURONTIN) 100 MG capsule Take 100 mg by mouth 3 (three) times daily.    Yes Historical Provider, MD  HYDROcodone-acetaminophen (NORCO/VICODIN) 5-325 MG per tablet Take 2 tablets by mouth every 4 (four) hours as needed for pain. 07/21/12  Yes Jennifer L Piepenbrink, PA-C  ibuprofen (ADVIL,MOTRIN) 200 MG tablet Take 400 mg by mouth every 6 (six) hours as needed for pain.    Yes Historical Provider, MD  insulin aspart protamine- aspart (NOVOLOG 70/30) (70-30) 100 UNIT/ML injection Inject 35 Units into the skin 2 (two) times daily.   Yes Historical Provider, MD  methocarbamol (ROBAXIN) 750 MG tablet Take 1,500 mg by mouth 3 (three) times daily.    Yes Historical Provider, MD  metoprolol tartrate (LOPRESSOR) 25 MG tablet Take 25 mg by mouth 2 (two) times daily.   Yes Historical Provider, MD  omeprazole (PRILOSEC) 40 MG capsule Take 40 mg by mouth every morning.    Yes Historical Provider, MD  ondansetron (ZOFRAN) 4 MG tablet Take 4 mg by mouth every 8 (eight) hours as needed for nausea.   Yes Historical Provider, MD  oxyCODONE-acetaminophen (PERCOCET/ROXICET) 5-325 MG per tablet Take 1 tablet by mouth every 8 (eight) hours as needed for pain.   Yes Historical Provider, MD  pravastatin (PRAVACHOL) 10 MG tablet Take 10 mg by mouth every morning.  Yes Historical Provider, MD  QUEtiapine (SEROQUEL) 200 MG tablet Take 200 mg by mouth at bedtime.   Yes Historical Provider, MD  risperiDONE (RISPERDAL) 1 MG tablet Take 1 mg by mouth every morning.    Yes Historical Provider, MD    Social History:  reports that she has quit smoking. Her smoking use included Cigarettes. She smoked 0.00 packs per day. She has never used smokeless tobacco. She reports that  drinks alcohol. She reports that she does not use illicit drugs.  Family History  Problem Relation Age of Onset  . COPD Mother   . Multiple sclerosis Mother     Review of Systems:  HEENT: Denies headache, blurred vision, runny nose, sore  throat,  Neck: Denies thyroid problems,lymphadenopathy Chest : Positive  shortness of breath, no history of COPD Heart : Denies Chest pain,  Positive h/o coronary arterey disease GI: See HPI GU: Denies dysuria, urgency, frequency of urination, hematuria Neuro: Denies stroke, seizures, syncope    Physical Exam: Blood pressure 133/89, pulse 102, temperature 98.3 F (36.8 C), temperature source Oral, resp. rate 16, height 5\' 6"  (1.676 m), weight 113.399 kg (250 lb), last menstrual period 07/09/2012, SpO2 97.00%. Constitutional:   Patient is a well-developed and well-nourished female in no acute distress and cooperative with exam. Head: Normocephalic and atraumatic Mouth: Mucus membranes moist Eyes: PERRL, EOMI, conjunctivae normal Neck: Supple, No Thyromegaly Cardiovascular: RRR, S1 normal, S2 normal Pulmonary/Chest: CTAB, no wheezes, rales, or rhonchi Abdominal: Soft. Non-tender, non-distended, bowel sounds are normal, no masses, organomegaly, or guarding present.  Neurological: A&O x3, Strenght is normal and symmetric bilaterally, cranial nerve II-XII are grossly intact, no focal motor deficit, sensory intact to light touch bilaterally.  Extremities : No Cyanosis, Clubbing or Edema   Labs on Admission:  No results found for this or any previous visit (from the past 48 hour(s)).  Radiological Exams on Admission: No results found.  Assessment/Plan Active Problems:   Nausea alone   Abdominal pain, unspecified site  Abdominal pain ? Cause, will obtain LFT's, lipase, also get xray abdomen Will start Miralax for constipation as CT on 6/10 showed moderate amount of stool in the colon.  CAD Continue aspirin, plavix, metoprolol  Diabetes mellitus Sliding scale insulin Will continue with Novolog70/30  Hypertension Vasaretic, Lopressor  DVT prophylaxis Lovenox  Code status: Full code  Family discussion: discussed with patient in detail   Time Spent on Admission: 55  min  Exa Bomba S [facility] Pager: [phone] 07/30/2012, 2:41 PM

## 2012-07-31 LAB — COMPREHENSIVE METABOLIC PANEL
ALT: 13 U/L (ref 0–35)
Albumin: 3 g/dL — ABNORMAL LOW (ref 3.5–5.2)
Alkaline Phosphatase: 71 U/L (ref 39–117)
Potassium: 3.4 mEq/L — ABNORMAL LOW (ref 3.5–5.1)
Sodium: 137 mEq/L (ref 135–145)
Total Protein: 6.4 g/dL (ref 6.0–8.3)

## 2012-07-31 LAB — GLUCOSE, CAPILLARY
Glucose-Capillary: 188 mg/dL — ABNORMAL HIGH (ref 70–99)
Glucose-Capillary: 206 mg/dL — ABNORMAL HIGH (ref 70–99)

## 2012-07-31 LAB — CBC
MCHC: 32.5 g/dL (ref 30.0–36.0)
RDW: 13.3 % (ref 11.5–15.5)

## 2012-07-31 MED ORDER — POTASSIUM CHLORIDE CRYS ER 20 MEQ PO TBCR
40.0000 meq | EXTENDED_RELEASE_TABLET | Freq: Once | ORAL | Status: AC
  Administered 2012-07-31: 40 meq via ORAL
  Filled 2012-07-31: qty 2

## 2012-07-31 MED ORDER — FLEET ENEMA 7-19 GM/118ML RE ENEM
1.0000 | ENEMA | Freq: Every day | RECTAL | Status: DC | PRN
  Administered 2012-08-01: 10:00:00 via RECTAL
  Filled 2012-07-31: qty 1

## 2012-07-31 NOTE — Progress Notes (Addendum)
[facility] PROGRESS NOTE  Destiny Meyer ZOX:[id number] DOB: 05-10-1975 DOA: 07/30/2012 PCP: Jackie Plum, MD  Assessment/Plan: Abdominal pain  -Unclear etiology, CT of 6/10 with moderate amount of stool in colon, normal appendix, Splenic cyst likely not etiology of pain -will add enema for constipation -LFTs wnl and lipase nl, UA of 6/10 was neg.- she is afebrlie with no leukocytosis CAD  Continue aspirin, plavix, metoprolol  Diabetes mellitus  Sliding scale insulin  Will continue with Novolog70/30  Hypertension  Vasoretic, Lopressor Constipation  -enema as above, follow recheck DVT prophylaxis  Lovenox      Code Status: full Family Communication: family at bedside Disposition Plan: to home when medically ready   Consultants:  none  Procedures:  none  Antibiotics:  none  HPI/Subjective: States tolerating po well, still with abd pain and no BM  Objective: Filed Vitals:   07/30/12 1300 07/30/12 2255 07/31/12 0602 07/31/12 0928  BP: 133/89 123/93 123/80 124/79  Pulse: 102 81 71   Temp: 98.3 F (36.8 C) 97.4 F (36.3 C) 97.5 F (36.4 C)   TempSrc: Oral Oral Oral   Resp: 16 16 18    Height: 5\' 6"  (1.676 m)     Weight: 113.399 kg (250 lb)     SpO2: 97% 99% 96%     Intake/Output Summary (Last 24 hours) at 07/31/12 1239 Last data filed at 07/31/12 1000  Gross per 24 hour  Intake    720 ml  Output   1150 ml  Net   -430 ml   Filed Weights   07/30/12 1300  Weight: 113.399 kg (250 lb)    Exam:   General:  alert and oriented x3 in NAD  Cardiovascular: RRR  Respiratory: CTAB  Abdomen: soft +BS mild periumbilical tenderness, ND, no masses palpable  Extremities: no edema and no cyanosis   Data Reviewed: Basic Metabolic Panel:  Recent Labs Lab 07/30/12 1507 07/31/12 0428  NA 137 137  K 3.9 3.4*  CL 101 102  CO2 25 26  GLUCOSE 359* 137*  BUN 8 11  CREATININE 0.90 0.92  CALCIUM 9.1 8.6   Liver Function Tests:  Recent  Labs Lab 07/30/12 1507 07/31/12 0428  AST 15 13  ALT 15 13  ALKPHOS 85 71  BILITOT 0.4 0.3  PROT 7.2 6.4  ALBUMIN 3.4* 3.0*    Recent Labs Lab 07/30/12 1507  LIPASE 36   No results found for this basename: AMMONIA,  in the last 168 hours CBC:  Recent Labs Lab 07/30/12 1507 07/31/12 0428  WBC 6.8 7.3  HGB 13.8 12.8  HCT 40.3 39.4  MCV 84.7 85.7  PLT 259 230   Cardiac Enzymes: No results found for this basename: CKTOTAL, CKMB, CKMBINDEX, TROPONINI,  in the last 168 hours BNP (last 3 results) No results found for this basename: PROBNP,  in the last 8760 hours CBG:  Recent Labs Lab 07/30/12 1817 07/30/12 2124 07/31/12 0815 07/31/12 1123  GLUCAP 370* 189* 188* 206*    No results found for this or any previous visit (from the past 240 hour(s)).   Studies: Dg Abd 2 Views  07/30/2012   *RADIOLOGY REPORT*  Clinical Data: Abdominal pain with nausea vomiting  ABDOMEN - 2 VIEW  Comparison: CT 07/21/2012  Findings: Normal bowel gas pattern.  Mild retained stool throughout the colon.  No free air or air-fluid level.  Cholecystectomy clips are noted.  No kidney stones.  IMPRESSION: Mild constipation without bowel obstruction.   Original Report Authenticated By: Janeece Riggers,  M.D.    Scheduled Meds: . amLODipine  10 mg Oral Daily  . aspirin EC  81 mg Oral Daily  . clopidogrel  75 mg Oral Daily  . colesevelam  1,875 mg Oral BID WC  . enalapril  10 mg Oral Daily  . enoxaparin (LOVENOX) injection  40 mg Subcutaneous Q24H  . gabapentin  200 mg Oral TID  . hydrochlorothiazide  25 mg Oral Daily  . insulin aspart  0-15 Units Subcutaneous TID WC  . insulin aspart protamine- aspart  35 Units Subcutaneous BID  . metoprolol tartrate  25 mg Oral BID  . polyethylene glycol  17 g Oral Daily  . QUEtiapine  200 mg Oral QHS  . risperiDONE  1 mg Oral Daily  . simvastatin  5 mg Oral q1800   Continuous Infusions: . sodium chloride 75 mL/hr at 07/31/12 0419    Active Problems:    Nausea alone   Abdominal pain, unspecified site    Time spent: 25    [facility]  [facility] Pager [phone]. If 7PM-7AM, please contact night-coverage at [email or web address], password [facility] 07/31/2012, 12:39 PM  LOS: 1 day

## 2012-08-01 DIAGNOSIS — I10 Essential (primary) hypertension: Secondary | ICD-10-CM | POA: Diagnosis present

## 2012-08-01 DIAGNOSIS — IMO0002 Reserved for concepts with insufficient information to code with codable children: Secondary | ICD-10-CM | POA: Diagnosis present

## 2012-08-01 DIAGNOSIS — E1165 Type 2 diabetes mellitus with hyperglycemia: Secondary | ICD-10-CM | POA: Diagnosis present

## 2012-08-01 DIAGNOSIS — K5900 Constipation, unspecified: Secondary | ICD-10-CM | POA: Diagnosis present

## 2012-08-01 LAB — GLUCOSE, CAPILLARY
Glucose-Capillary: 78 mg/dL (ref 70–99)
Glucose-Capillary: 156 mg/dL — ABNORMAL HIGH (ref 70–99)
Glucose-Capillary: 137 mg/dL — ABNORMAL HIGH (ref 70–99)

## 2012-08-01 LAB — BASIC METABOLIC PANEL WITH GFR
BUN: 10 mg/dL (ref 6–23)
CO2: 20 meq/L (ref 19–32)
Calcium: 8.6 mg/dL (ref 8.4–10.5)
Chloride: 103 meq/L (ref 96–112)
Creatinine, Ser: 0.77 mg/dL (ref 0.50–1.10)
GFR calc Af Amer: >90 mL/min
GFR calc non Af Amer: >90 mL/min
Glucose, Bld: 160 mg/dL — ABNORMAL HIGH (ref 70–99)
Potassium: 4.7 meq/L (ref 3.5–5.1)
Sodium: 136 meq/L (ref 135–145)

## 2012-08-01 MED ORDER — IBUPROFEN 600 MG PO TABS
600.0000 mg | ORAL_TABLET | Freq: Three times a day (TID) | ORAL | Status: AC
  Administered 2012-08-01 – 2012-08-02 (×3): 600 mg via ORAL
  Filled 2012-08-01 (×4): qty 1

## 2012-08-01 NOTE — Progress Notes (Signed)
Patient has had miralax, fleets and suppository this shift with no results.

## 2012-08-01 NOTE — Progress Notes (Signed)
[facility] PROGRESS NOTE  Destiny Meyer ZOX:[id number] DOB: March 02, 1975 DOA: 07/30/2012 PCP: Jackie Plum, MD  Assessment/Plan: Abdominal pain  -Unclear etiology, CT of 6/10 with moderate amount of stool in colon, normal appendix, Splenic cyst likely not etiology of pain -will add enema for constipation -LFTs wnl and lipase nl, UA of 6/10 was neg.- she is afebrlie with no leukocytosis - Etiology:? Muscular versus secondary to constipation. - Suppository has not worked. Fleet enema. NSAIDs trial.  CAD  Continue aspirin, plavix, metoprolol  Asymptomatic.  Diabetes mellitus  Sliding scale insulin  Will continue with Novolog70/30  Fluctuating.  Hypertension  Vasoretic, Lopressor Controlled.  Constipation  -enema as above, follow recheck -Enema today.  DVT prophylaxis  Lovenox    Code Status: Full Family Communication: family at bedside Disposition Plan: Possible discharge in a.m., if pain has improved.   Consultants:  None  Procedures:  None  Antibiotics:  None  HPI/Subjective: Indicates that the umbilical intermittent 9/10, nonradiating pain that is made worse with when she tries to bend/setup, no relationship to food. Passing flatus. No BM since admission. Rectal suppository without effect. Tolerating diet.  Objective: Filed Vitals:   07/31/12 2115 08/01/12 0546 08/01/12 1029 08/01/12 1400  BP: 114/78 113/70 120/79 100/67  Pulse: 79 73 80 79  Temp: 97.9 F (36.6 C) 98.4 F (36.9 C)  98.2 F (36.8 C)  TempSrc: Oral Oral  Oral  Resp: 18 16  18   Height:      Weight:      SpO2: 95% 97%  94%    Intake/Output Summary (Last 24 hours) at 08/01/12 1608 Last data filed at 08/01/12 1400  Gross per 24 hour  Intake 3671.25 ml  Output    500 ml  Net 3171.25 ml   Filed Weights   07/30/12 1300  Weight: 113.399 kg (250 lb)    Exam:   General:  alert and oriented x3 in NAD  Cardiovascular: RRR  Respiratory: CTAB. No increased work of  breathing  Abdomen: Nondistended, soft and normal bowel sounds present. Mild periumbilical tenderness without rigidity, guarding or rebound.  Extremities: no edema and no cyanosis   Data Reviewed: Basic Metabolic Panel:  Recent Labs Lab 07/30/12 1507 07/31/12 0428 08/01/12 0910  NA 137 137 136  K 3.9 3.4* 4.7  CL 101 102 103  CO2 25 26 20   GLUCOSE 359* 137* 160*  BUN 8 11 10   CREATININE 0.90 0.92 0.77  CALCIUM 9.1 8.6 8.6   Liver Function Tests:  Recent Labs Lab 07/30/12 1507 07/31/12 0428  AST 15 13  ALT 15 13  ALKPHOS 85 71  BILITOT 0.4 0.3  PROT 7.2 6.4  ALBUMIN 3.4* 3.0*    Recent Labs Lab 07/30/12 1507  LIPASE 36   No results found for this basename: AMMONIA,  in the last 168 hours CBC:  Recent Labs Lab 07/30/12 1507 07/31/12 0428  WBC 6.8 7.3  HGB 13.8 12.8  HCT 40.3 39.4  MCV 84.7 85.7  PLT 259 230   Cardiac Enzymes: No results found for this basename: CKTOTAL, CKMB, CKMBINDEX, TROPONINI,  in the last 168 hours BNP (last 3 results) No results found for this basename: PROBNP,  in the last 8760 hours CBG:  Recent Labs Lab 07/31/12 1123 07/31/12 1616 07/31/12 2225 08/01/12 0752 08/01/12 1156  GLUCAP 206* 230* 261* 78 156*    No results found for this or any previous visit (from the past 240 hour(s)).   Studies: No results found.  Scheduled Meds: .  amLODipine  10 mg Oral Daily  . aspirin EC  81 mg Oral Daily  . clopidogrel  75 mg Oral Daily  . colesevelam  1,875 mg Oral BID WC  . enalapril  10 mg Oral Daily  . enoxaparin (LOVENOX) injection  40 mg Subcutaneous Q24H  . gabapentin  200 mg Oral TID  . hydrochlorothiazide  25 mg Oral Daily  . insulin aspart  0-15 Units Subcutaneous TID WC  . insulin aspart protamine- aspart  35 Units Subcutaneous BID  . metoprolol tartrate  25 mg Oral BID  . polyethylene glycol  17 g Oral Daily  . QUEtiapine  200 mg Oral QHS  . risperiDONE  1 mg Oral Daily  . simvastatin  5 mg Oral q1800    Continuous Infusions: . sodium chloride 75 mL/hr at 08/01/12 0404    Active Problems:   Nausea alone   Abdominal pain, unspecified site    Time spent: 25    [facility]  [facility] Pager [phone]. If 7PM-7AM, please contact night-coverage at [email or web address], password [facility] 08/01/2012, 4:08 PM  LOS: 2 days

## 2012-08-02 LAB — GLUCOSE, CAPILLARY
Glucose-Capillary: 157 mg/dL — ABNORMAL HIGH (ref 70–99)
Glucose-Capillary: 167 mg/dL — ABNORMAL HIGH (ref 70–99)
Glucose-Capillary: 159 mg/dL — ABNORMAL HIGH (ref 70–99)

## 2012-08-02 MED ORDER — SORBITOL 70 % SOLN
30.0000 mL | Freq: Once | Status: AC
  Administered 2012-08-02: 30 mL via ORAL
  Filled 2012-08-02: qty 30

## 2012-08-02 MED ORDER — SORBITOL 70 % SOLN
960.0000 mL | TOPICAL_OIL | Freq: Once | ORAL | Status: AC
  Administered 2012-08-02: 960 mL via RECTAL
  Filled 2012-08-02: qty 240

## 2012-08-02 MED ORDER — VITAMINS A & D EX OINT
TOPICAL_OINTMENT | CUTANEOUS | Status: AC
  Administered 2012-08-02: 21:00:00
  Filled 2012-08-02: qty 5

## 2012-08-02 MED ORDER — ALBUTEROL SULFATE HFA 108 (90 BASE) MCG/ACT IN AERS
2.0000 | INHALATION_SPRAY | RESPIRATORY_TRACT | Status: DC | PRN
  Administered 2012-08-02: 2 via RESPIRATORY_TRACT
  Filled 2012-08-02: qty 6.7

## 2012-08-02 NOTE — Progress Notes (Signed)
[facility] PROGRESS NOTE  Assessment/Plan: Abdominal pain, unspecified site ? Due to constipation: - repeat abdominal x-ray  if no BM. - cont miralax, add sorbitol and SMOG. - cont to tolerate diet, passing gas and + BM. - Lipase, LFT, U/A negative.  Diabetes mellitus type 2, uncontrolled - mildly control. - cont Lantus + SSI.  Essential hypertension, benign - stable.   Nausea alone: - now resolved, tolerating diet.   Code Status: full Family Communication: none  Disposition Plan: inpatient.   Consultants:  none  Procedures:  Abd x-ray  Antibiotics:  None  HPI/Subjective: She has mild abdominal pain, passing gas tolerating diet.  Objective: Filed Vitals:   08/01/12 1400 08/01/12 2203 08/02/12 0500 08/02/12 1002  BP: 100/67 105/70 95/68 121/84  Pulse: 79 79 96 84  Temp: 98.2 F (36.8 C) 98.1 F (36.7 C) 97.4 F (36.3 C)   TempSrc: Oral Oral Oral   Resp: 18 18 16    Height:      Weight:      SpO2: 94% 95% 95%     Intake/Output Summary (Last 24 hours) at 08/02/12 1316 Last data filed at 08/02/12 0853  Gross per 24 hour  Intake    720 ml  Output    600 ml  Net    120 ml   Filed Weights   07/30/12 1300  Weight: 113.399 kg (250 lb)    Exam:  General: Alert, awake, oriented x3, in no acute distress.  HEENT: No bruits, no goiter.  Heart: Regular rate and rhythm, without murmurs, rubs, gallops.  Lungs: Good air movement, bilateral air movement.  Abdomen: Soft, tender in epigastric area and left flank, no rebound or guarding. Neuro: Grossly intact, nonfocal.   Data Reviewed: Basic Metabolic Panel:  Recent Labs Lab 07/30/12 1507 07/31/12 0428 08/01/12 0910  NA 137 137 136  K 3.9 3.4* 4.7  CL 101 102 103  CO2 25 26 20   GLUCOSE 359* 137* 160*  BUN 8 11 10   CREATININE 0.90 0.92 0.77  CALCIUM 9.1 8.6 8.6   Liver Function Tests:  Recent Labs Lab 07/30/12 1507 07/31/12 0428  AST 15 13  ALT 15 13  ALKPHOS 85 71  BILITOT 0.4  0.3  PROT 7.2 6.4  ALBUMIN 3.4* 3.0*    Recent Labs Lab 07/30/12 1507  LIPASE 36   No results found for this basename: AMMONIA,  in the last 168 hours CBC:  Recent Labs Lab 07/30/12 1507 07/31/12 0428  WBC 6.8 7.3  HGB 13.8 12.8  HCT 40.3 39.4  MCV 84.7 85.7  PLT 259 230   Cardiac Enzymes: No results found for this basename: CKTOTAL, CKMB, CKMBINDEX, TROPONINI,  in the last 168 hours BNP (last 3 results) No results found for this basename: PROBNP,  in the last 8760 hours CBG:  Recent Labs Lab 08/01/12 1156 08/01/12 1613 08/01/12 2201 08/02/12 0728 08/02/12 1121  GLUCAP 156* 137* 157* 71 167*    No results found for this or any previous visit (from the past 240 hour(s)).   Studies: No results found.  Scheduled Meds: . amLODipine  10 mg Oral Daily  . aspirin EC  81 mg Oral Daily  . clopidogrel  75 mg Oral Daily  . colesevelam  1,875 mg Oral BID WC  . enalapril  10 mg Oral Daily  . enoxaparin (LOVENOX) injection  40 mg Subcutaneous Q24H  . gabapentin  200 mg Oral TID  . hydrochlorothiazide  25 mg Oral Daily  . insulin aspart  0-15 Units Subcutaneous TID WC  . insulin aspart protamine- aspart  35 Units Subcutaneous BID  . metoprolol tartrate  25 mg Oral BID  . polyethylene glycol  17 g Oral Daily  . QUEtiapine  200 mg Oral QHS  . risperiDONE  1 mg Oral Daily  . simvastatin  5 mg Oral q1800  . sorbitol  30 mL Oral Once  . sorbitol, milk of mag, mineral oil, glycerin (SMOG) enema  960 mL Rectal Once   Continuous Infusions:    Marinda Elk  [facility] Pager [phone]. If 8PM-8AM, please contact night-coverage at [email or web address], password [facility] 08/02/2012, 1:16 PM  LOS: 3 days

## 2012-08-03 ENCOUNTER — Observation Stay (HOSPITAL_COMMUNITY): Payer: Medicaid Other

## 2012-08-03 MED ORDER — POLYETHYLENE GLYCOL 3350 17 G PO PACK: 17.0000 g | PACK | Freq: Every day | ORAL | Status: AC

## 2012-08-03 MED ORDER — OXYCODONE-ACETAMINOPHEN 5-325 MG PO TABS: 1.0000 | ORAL_TABLET | Freq: Three times a day (TID) | ORAL | Status: AC | PRN

## 2012-08-03 MED ORDER — SORBITOL 70 % SOLN
30.0000 mL | Freq: Once | Status: AC
  Administered 2012-08-03: 30 mL via ORAL
  Filled 2012-08-03 (×2): qty 30

## 2012-08-03 MED ORDER — SORBITOL 70 % SOLN
960.0000 mL | TOPICAL_OIL | Freq: Once | ORAL | Status: AC
  Administered 2012-08-03: 960 mL via RECTAL
  Filled 2012-08-03: qty 240

## 2012-08-03 NOTE — Progress Notes (Signed)
Pt for d/c to home today. IV d/c'd. SMOG enema give as well as Sorbitol with medium amount of stool noted. Discharge instructions  & RX given to pt prior to d/c with verbalized understanding. Pt tolerated diet. Pain med given for abdominal pain. Awaiting for family to pick her up.

## 2012-08-03 NOTE — Progress Notes (Deleted)
Pt for d/c to [facility] today. PICC line d/c'd per IV team.  Dressing to L toe/foot changed & aquacel dressing strip applied. Contact precautions for draining wound in place.Pt adamantly refused to use disposable hospital gown(top/bottom). Son & dau called to bring clothes to wear per pt's preferrence. Will transport to [facility] once ready with his clothes he prefers to use. Discharge instructions given to pt prior to d/c.

## 2012-08-03 NOTE — Discharge Summary (Signed)
Physician Discharge Summary  Destiny Meyer ZOX:[id number] DOB: 02-07-1976 DOA: 07/30/2012  PCP: Jackie Plum, MD  Admit date: 07/30/2012 Discharge date: 08/03/2012  Time spent: >30 minutes  Recommendations for Outpatient Follow-up:  1. Follow up with PCP  Discharge Diagnoses:  Active Problems:   Abdominal pain, unspecified site   Nausea alone   Diabetes mellitus type 2, uncontrolled   Essential hypertension, benign   Unspecified constipation   Discharge Condition: stable  Diet recommendation: heart healthy diet  Filed Weights   07/30/12 1300  Weight: 113.399 kg (250 lb)    History of present illness:  37 yr old female with h/o CAD, HTN, DM, Asthma was sent from PCP office for abdominal pain. Patient also has been having nausea for past one week. Patient says the pain is located on the umbilical region, and comes off and on. She had CT abdomen done on 6/10 which showed 10 mm splenic cyst, and no other significant abnormality. Patient says that she has poor appetite.she denies chest pain, admits to shortness of breath. Patient was treated with po Cipro for UTI.   Hospital Course:  Abdominal pain, unspecified site ? Due to constipation:  - cont miralax, add sorbitol and SMOG.  Pt had 3 large BM. - abdominal x-ray was done which showed moderate stool burden at sorbital was repeated. - Lipase, LFT, U/A negative.  - Diabetes mellitus type 2, uncontrolled  - mildly control.  - cont Lantus + SSI.   Essential hypertension, benign  - stable.   Nausea alone:  - now resolved, tolerating diet.   Procedures:  Abdominal x-ray  Consultations:  none  Discharge Exam: Filed Vitals:   08/02/12 1450 08/02/12 2111 08/03/12 0600 08/03/12 1004  BP: 131/89 108/60 88/49 114/80  Pulse: 75 76 68 76  Temp: 97.6 F (36.4 C) 97.7 F (36.5 C) 97.5 F (36.4 C)   TempSrc: Oral Oral Oral   Resp: 16 16 16    Height:      Weight:      SpO2: 98% 97% 95%     General: A&O  x3 Cardiovascular: RRR Respiratory: good air movement CTA B/L  Discharge Instructions  Discharge Orders   Future Orders Complete By Expires     Diet - low sodium heart healthy  As directed     Increase activity slowly  As directed         Medication List    STOP taking these medications       ciprofloxacin 500 MG tablet  Commonly known as:  CIPRO     HYDROcodone-acetaminophen 5-325 MG per tablet  Commonly known as:  NORCO/VICODIN      TAKE these medications       amLODipine 10 MG tablet  Commonly known as:  NORVASC  Take 10 mg by mouth every morning.     aspirin 81 MG tablet  Take 81 mg by mouth every morning.     clopidogrel 75 MG tablet  Commonly known as:  PLAVIX  Take 75 mg by mouth every morning.     colesevelam 625 MG tablet  Commonly known as:  WELCHOL  Take 625 mg by mouth 2 (two) times daily with a meal.     diphenhydrAMINE 25 MG tablet  Commonly known as:  BENADRYL  Take 1 tablet (25 mg total) by mouth every 6 (six) hours as needed for itching (Rash).     enalapril-hydrochlorothiazide 10-25 MG per tablet  Commonly known as:  VASERETIC  Take 1 tablet by mouth every  morning.     gabapentin 100 MG capsule  Commonly known as:  NEURONTIN  Take 100 mg by mouth 3 (three) times daily.     ibuprofen 200 MG tablet  Commonly known as:  ADVIL,MOTRIN  Take 400 mg by mouth every 6 (six) hours as needed for pain.     insulin aspart protamine- aspart (70-30) 100 UNIT/ML injection  Commonly known as:  NOVOLOG MIX 70/30  Inject 35 Units into the skin 2 (two) times daily.     methocarbamol 750 MG tablet  Commonly known as:  ROBAXIN  Take 1,500 mg by mouth 3 (three) times daily.     metoprolol tartrate 25 MG tablet  Commonly known as:  LOPRESSOR  Take 25 mg by mouth 2 (two) times daily.     omeprazole 40 MG capsule  Commonly known as:  PRILOSEC  Take 40 mg by mouth every morning.     ondansetron 4 MG tablet  Commonly known as:  ZOFRAN  Take 4 mg by  mouth every 8 (eight) hours as needed for nausea.     oxyCODONE-acetaminophen 5-325 MG per tablet  Commonly known as:  PERCOCET/ROXICET  Take 1 tablet by mouth every 8 (eight) hours as needed for pain.     polyethylene glycol packet  Commonly known as:  MIRALAX / GLYCOLAX  Take 17 g by mouth daily.     pravastatin 10 MG tablet  Commonly known as:  PRAVACHOL  Take 10 mg by mouth every morning.     QUEtiapine 200 MG tablet  Commonly known as:  SEROQUEL  Take 200 mg by mouth at bedtime.     risperiDONE 1 MG tablet  Commonly known as:  RISPERDAL  Take 1 mg by mouth every morning.       Allergies  Allergen Reactions  . Eggs Or Egg-Derived Products Hives  . Levaquin (Levofloxacin In D5w) Nausea And Vomiting  . Codeine Itching and Rash  . Macrobid (Nitrofurantoin Macrocrystal) Itching and Rash  . Penicillins Itching and Rash       Follow-up Information   Follow up with OSEI-BONSU,GEORGE, MD In 2 weeks. (hospital follow up)    Contact information:   [address], [address] [place] [address] [phone]        The results of significant diagnostics from this hospitalization (including imaging, microbiology, ancillary and laboratory) are listed below for reference.    Significant Diagnostic Studies: Dg Abd 1 View  08/03/2012   *RADIOLOGY REPORT*  Clinical Data: Constipation, mid abdominal pain, nausea and vomiting  ABDOMEN - 1 VIEW  Comparison: 07/30/2012; CT abdomen pelvis of 07/21/2012  Findings:  Moderate colonic stool burden without evidence of obstruction. Evaluation for pneumoperitoneum is degraded secondary to supine positioning and exclusion of the lower thorax.  No definite pneumatosis or portal venous gas. No abnormal intra-abdominal calcifications.  Post cholecystectomy.  No acute osseous abnormality.  IMPRESSION: Moderate colonic stool burden without evidence of obstruction.   Original Report Authenticated By: Tacey Ruiz, MD   Ct Abdomen Pelvis W  Contrast  07/21/2012   *RADIOLOGY REPORT*  Clinical Data: Abdominal and pelvic pain, history of hernia repair and cholecystectomy  CT ABDOMEN AND PELVIS WITH CONTRAST  Technique:  Multidetector CT imaging of the abdomen and pelvis was performed following the standard protocol during bolus administration of intravenous contrast.  Contrast: OMNIPAQUE IOHEXOL 300 MG/ML  SOLN  Comparison: None.  Findings: Minimal dependent basilar atelectasis.  No lower lobe pneumonia.  No pericardial or  pleural effusion.  Normal heart size.  Abdomen:  Prior cholecystectomy evident.  No focal hepatic abnormality.  Patent portal vein.  No biliary dilatation.  Biliary system, pancreas, and adrenal glands are within normal limits for age and demonstrate no acute process.  Minor cortical thinning of the left kidney otherwise no acute renal process.  No hydronephrosis or obstruction.  No obstructing ureteral calculus.  Spleen demonstrates a small subcapsular hypodense 10 mm cyst posteriorly, image 16 but is confirmed on the sagittal and coronal reconstructions.  No other splenic abnormality.  No abdominal free fluid, fluid collection, hemorrhage, abscess, or adenopathy.  Negative for bowel obstruction, dilatation, ileus, or free air.  Transverse colon is collapsed.  Normal appendix demonstrated.  Postop changes of the umbilical area.  No recurrent abdominal wall or inguinal hernia demonstrated.  Pelvis:  Moderate retained stool in the distal colon.  Urinary bladder unremarkable.  Uterus and adnexa normal in size.  Symmetric ovaries.  No pelvic free fluid, fluid collection, hemorrhage, abscess, adenopathy, or inguinal abnormality.  No acute or abnormal osseous finding.  IMPRESSION: Prior cholecystectomy.  Incidental splenic cyst measuring 10 mm.  No recurrent abdominal wall or ventral hernia.  Normal appendix  Moderate retained stool in the distal colon  No acute intra-abdominal or pelvic process demonstrated.   Original Report  Authenticated By: Judie Petit. Miles Costain, M.D.   Dg Abd 2 Views  07/30/2012   *RADIOLOGY REPORT*  Clinical Data: Abdominal pain with nausea vomiting  ABDOMEN - 2 VIEW  Comparison: CT 07/21/2012  Findings: Normal bowel gas pattern.  Mild retained stool throughout the colon.  No free air or air-fluid level.  Cholecystectomy clips are noted.  No kidney stones.  IMPRESSION: Mild constipation without bowel obstruction.   Original Report Authenticated By: Janeece Riggers, M.D.    Microbiology: No results found for this or any previous visit (from the past 240 hour(s)).   Labs: Basic Metabolic Panel:  Recent Labs Lab 07/30/12 1507 07/31/12 0428 08/01/12 0910  NA 137 137 136  K 3.9 3.4* 4.7  CL 101 102 103  CO2 25 26 20   GLUCOSE 359* 137* 160*  BUN 8 11 10   CREATININE 0.90 0.92 0.77  CALCIUM 9.1 8.6 8.6   Liver Function Tests:  Recent Labs Lab 07/30/12 1507 07/31/12 0428  AST 15 13  ALT 15 13  ALKPHOS 85 71  BILITOT 0.4 0.3  PROT 7.2 6.4  ALBUMIN 3.4* 3.0*    Recent Labs Lab 07/30/12 1507  LIPASE 36   No results found for this basename: AMMONIA,  in the last 168 hours CBC:  Recent Labs Lab 07/30/12 1507 07/31/12 0428  WBC 6.8 7.3  HGB 13.8 12.8  HCT 40.3 39.4  MCV 84.7 85.7  PLT 259 230   Cardiac Enzymes: No results found for this basename: CKTOTAL, CKMB, CKMBINDEX, TROPONINI,  in the last 168 hours BNP: BNP (last 3 results) No results found for this basename: PROBNP,  in the last 8760 hours CBG:  Recent Labs Lab 08/02/12 0728 08/02/12 1121 08/02/12 1636 08/02/12 2109 08/03/12 0728  GLUCAP 71 167* 159* 189* 72       Signed:  FELIZ ORTIZ, Nishka Heide  [facility] 08/03/2012, 11:22 AM

## 2014-10-14 IMAGING — CT CT ABD-PELV W/ CM
1 of 2 series · 15 of 32 positions shown, 19 images · IV contrast (OMNIPAQUE 300)
Comparison: None.

CLINICAL DATA: Abdominal and pelvic pain, history of hernia repair
and cholecystectomy

CT ABDOMEN AND PELVIS WITH CONTRAST
TECHNIQUE: Multidetector CT imaging of the abdomen and pelvis was
performed following the standard protocol during bolus
administration of intravenous contrast.
Contrast: 100mL OMNIPAQUE IOHEXOL 300 MG/ML  SOLN

[Series 2: abd/pel with · axial · 0.76mm/px · z∈[-481,-31]mm · 15 of 98 slices shown, 19 images]
[im 4/98  soft-tissue]
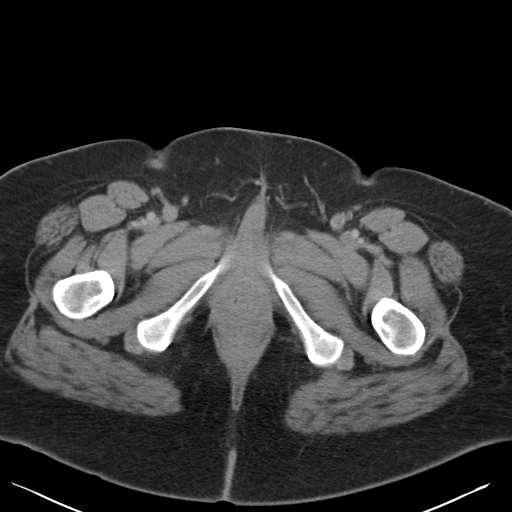
[im 4/98  bone]
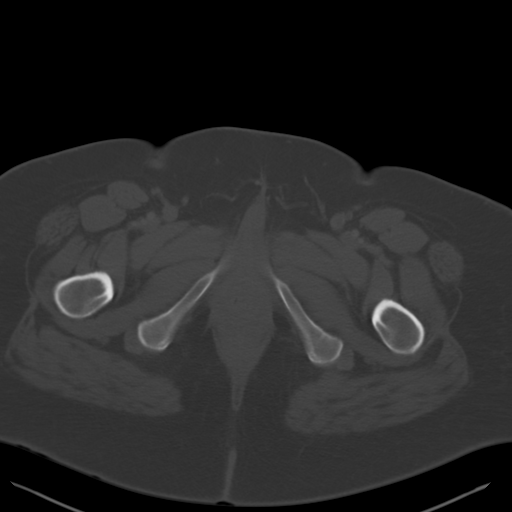
[im 12/98  soft-tissue]
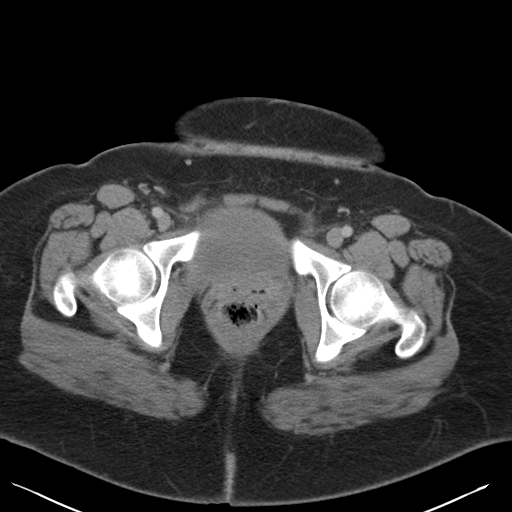
[im 19/98  soft-tissue]
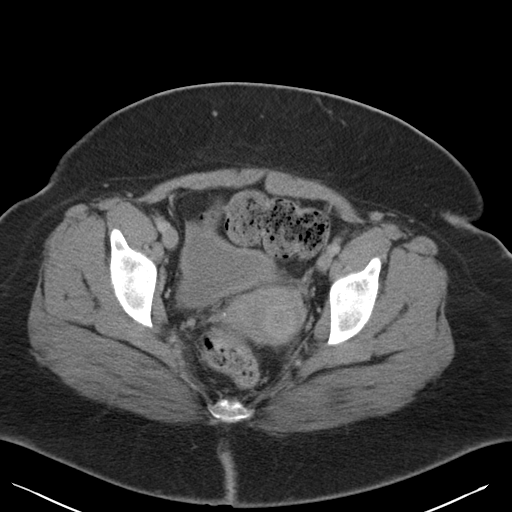
[im 27/98  soft-tissue]
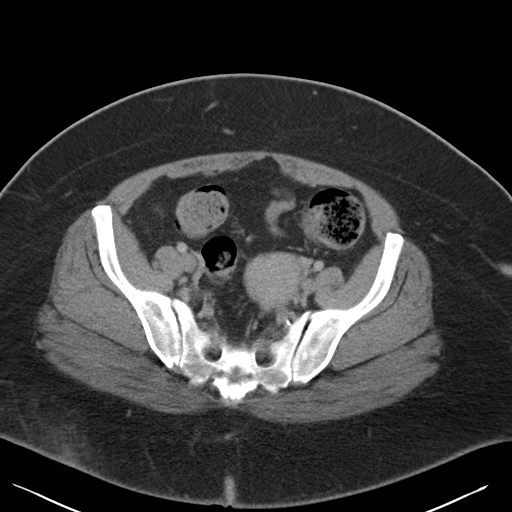
[im 34/98  soft-tissue]
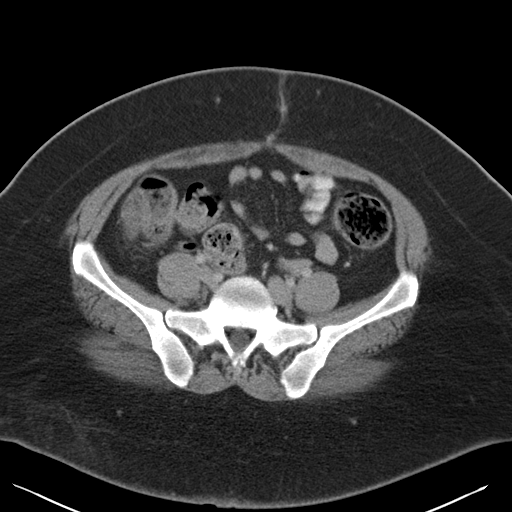
[im 42/98  soft-tissue]
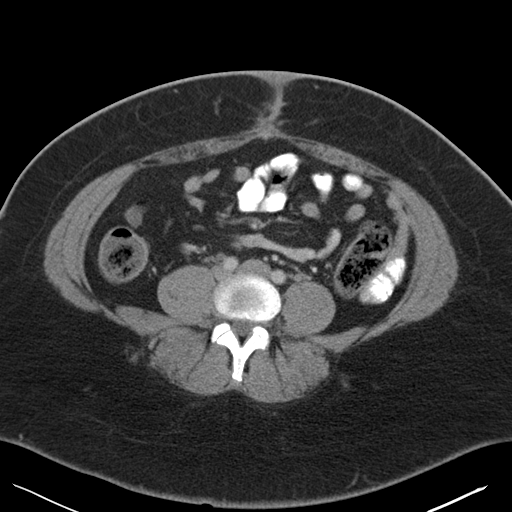
[im 49/98  soft-tissue]
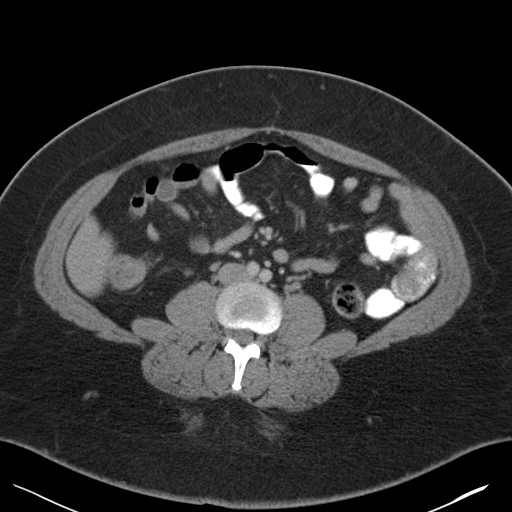
[im 56/98  soft-tissue]
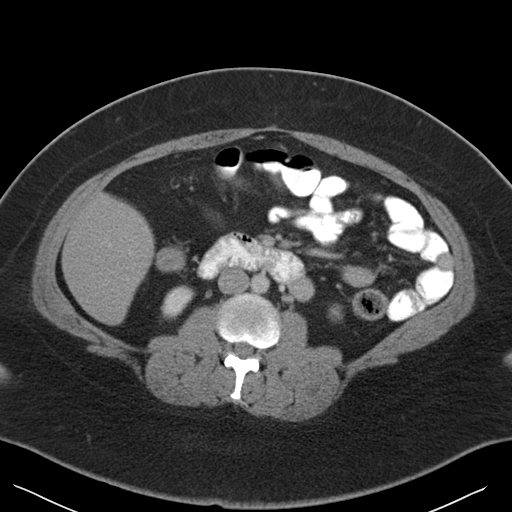
[im 64/98  soft-tissue]
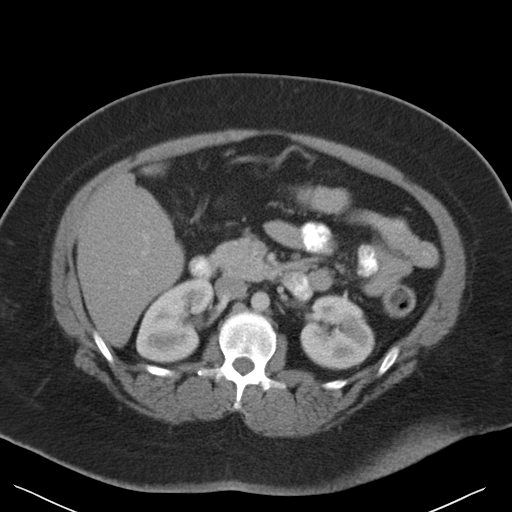
[im 64/98  bone]
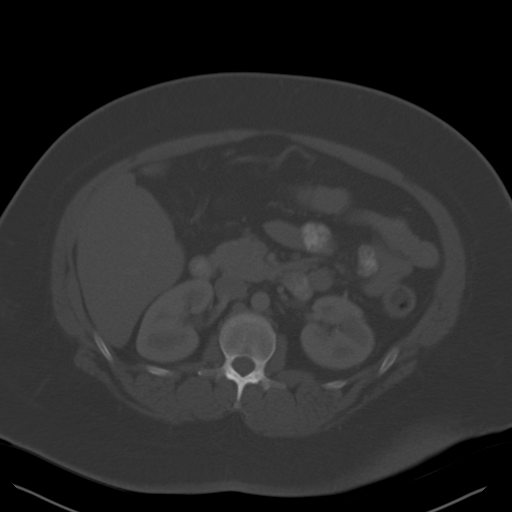
[im 71/98  soft-tissue]
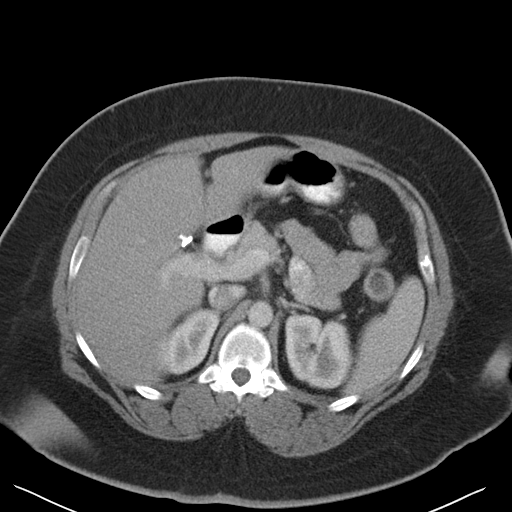
[im 79/98  soft-tissue]
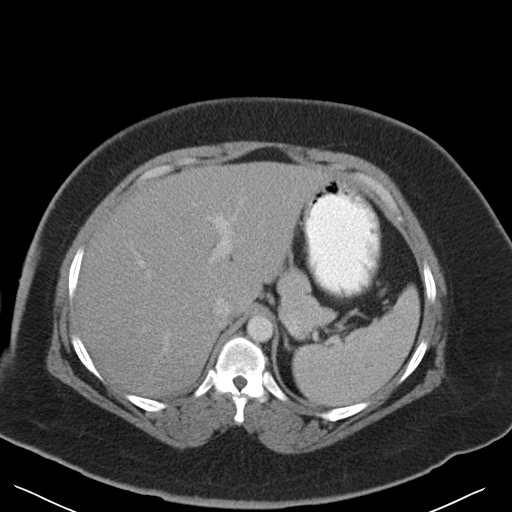
[im 83/98  lung]
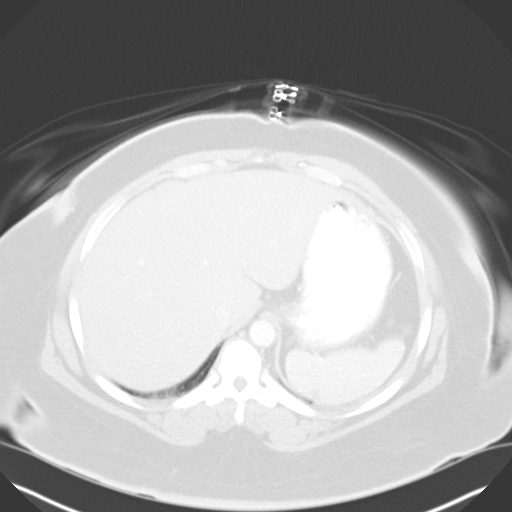
[im 86/98  soft-tissue]
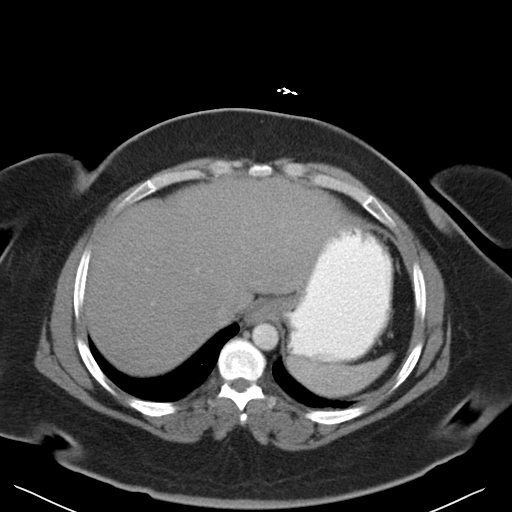
[im 86/98  lung]
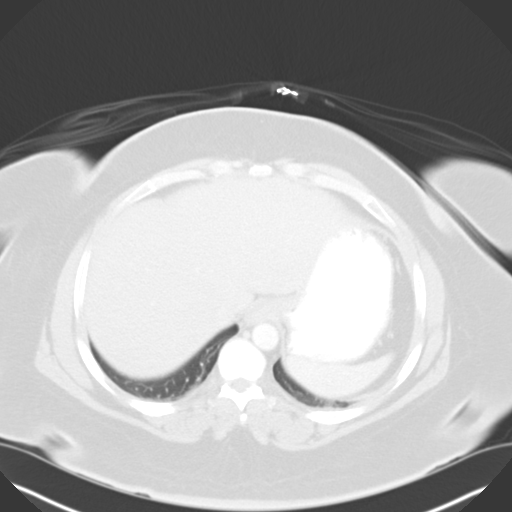
[im 90/98  lung]
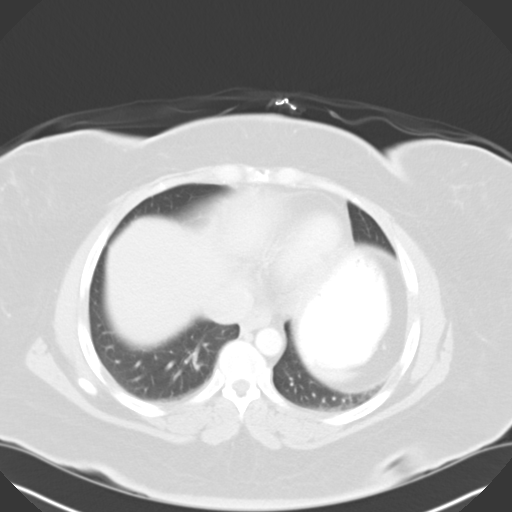
[im 94/98  soft-tissue]
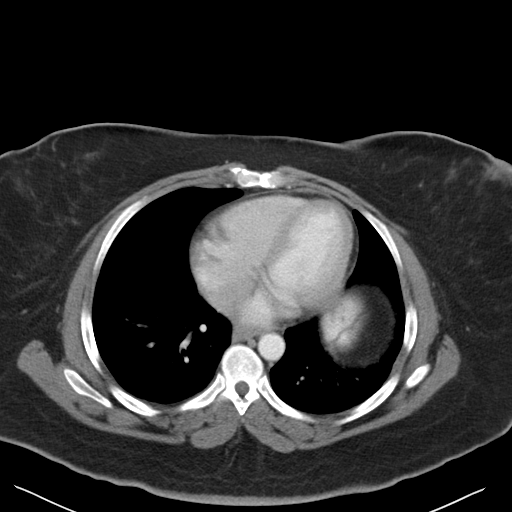
[im 94/98  lung]
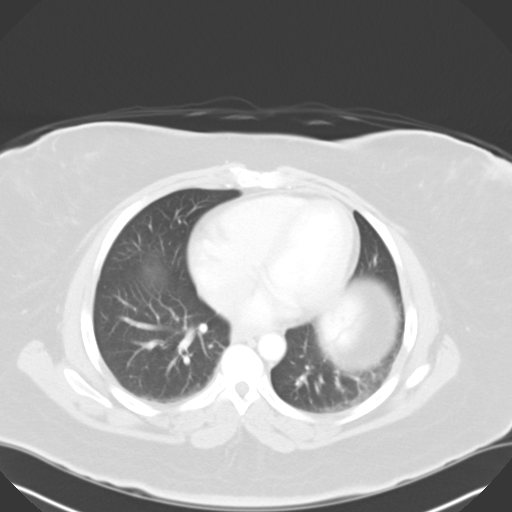

[15 of 32 positions shown; findings below may reference images not displayed]

FINDINGS: Minimal dependent basilar atelectasis.  No lower lobe
pneumonia.  No pericardial or pleural effusion.  Normal heart size.

Abdomen:  Prior cholecystectomy evident.  No focal hepatic
abnormality.  Patent portal vein.  No biliary dilatation.

Biliary system, pancreas, and adrenal glands are within normal
limits for age and demonstrate no acute process.  Minor cortical
thinning of the left kidney otherwise no acute renal process.  No
hydronephrosis or obstruction.  No obstructing ureteral calculus.

Spleen demonstrates a small subcapsular hypodense 10 mm cyst
posteriorly, image 16 but is confirmed on the sagittal and coronal
reconstructions.  No other splenic abnormality.

No abdominal free fluid, fluid collection, hemorrhage, abscess, or
adenopathy.

Negative for bowel obstruction, dilatation, ileus, or free air.

Transverse colon is collapsed.

Normal appendix demonstrated.

Postop changes of the umbilical area.  No recurrent abdominal wall
or inguinal hernia demonstrated.

Pelvis:  Moderate retained stool in the distal colon.  Urinary
bladder unremarkable.  Uterus and adnexa normal in size.  Symmetric
ovaries.  No pelvic free fluid, fluid collection, hemorrhage,
abscess, adenopathy, or inguinal abnormality.

No acute or abnormal osseous finding.
IMPRESSION: Prior cholecystectomy.

Incidental splenic cyst measuring 10 mm.

No recurrent abdominal wall or ventral hernia.

Normal appendix

Moderate retained stool in the distal colon

No acute intra-abdominal or pelvic process demonstrated.

## 2015-02-01 ENCOUNTER — Ambulatory Visit: Payer: Medicaid Other | Admitting: Podiatry

## 2018-06-04 ENCOUNTER — Emergency Department (HOSPITAL_COMMUNITY): Payer: Medicaid Other

## 2018-06-04 ENCOUNTER — Emergency Department (HOSPITAL_COMMUNITY)
Admission: EM | Admit: 2018-06-04 | Discharge: 2018-06-04 | Disposition: A | Discharge status: Home / Self Care | Payer: Medicaid Other | Attending: Emergency Medicine | Admitting: Emergency Medicine

## 2018-06-04 ENCOUNTER — Encounter (HOSPITAL_COMMUNITY): Payer: Self-pay

## 2018-06-04 ENCOUNTER — Other Ambulatory Visit: Payer: Self-pay

## 2018-06-04 DIAGNOSIS — E119 Type 2 diabetes mellitus without complications: Secondary | ICD-10-CM | POA: Insufficient documentation

## 2018-06-04 DIAGNOSIS — Z7982 Long term (current) use of aspirin: Secondary | ICD-10-CM | POA: Insufficient documentation

## 2018-06-04 DIAGNOSIS — Z86718 Personal history of other venous thrombosis and embolism: Secondary | ICD-10-CM | POA: Diagnosis not present

## 2018-06-04 DIAGNOSIS — Z7901 Long term (current) use of anticoagulants: Secondary | ICD-10-CM | POA: Insufficient documentation

## 2018-06-04 DIAGNOSIS — R079 Chest pain, unspecified: Secondary | ICD-10-CM

## 2018-06-04 DIAGNOSIS — R0602 Shortness of breath: Secondary | ICD-10-CM | POA: Diagnosis not present

## 2018-06-04 DIAGNOSIS — Z794 Long term (current) use of insulin: Secondary | ICD-10-CM | POA: Insufficient documentation

## 2018-06-04 DIAGNOSIS — I10 Essential (primary) hypertension: Secondary | ICD-10-CM | POA: Diagnosis not present

## 2018-06-04 DIAGNOSIS — Z79899 Other long term (current) drug therapy: Secondary | ICD-10-CM | POA: Diagnosis not present

## 2018-06-04 DIAGNOSIS — R0789 Other chest pain: Secondary | ICD-10-CM | POA: Insufficient documentation

## 2018-06-04 DIAGNOSIS — Z87891 Personal history of nicotine dependence: Secondary | ICD-10-CM | POA: Insufficient documentation

## 2018-06-04 LAB — CBC WITH DIFFERENTIAL/PLATELET
Abs Immature Granulocytes: 0.04 10*3/uL (ref 0.00–0.07)
Basophils Absolute: 0.1 10*3/uL (ref 0.0–0.1)
Basophils Relative: 1 %
Eosinophils Absolute: 0.2 10*3/uL (ref 0.0–0.5)
Eosinophils Relative: 2 %
HCT: 42.4 % (ref 36.0–46.0)
Hemoglobin: 13.6 g/dL (ref 12.0–15.0)
Immature Granulocytes: 0 %
Lymphocytes Relative: 39 %
Lymphs Abs: 4.2 10*3/uL — ABNORMAL HIGH (ref 0.7–4.0)
MCH: 28.7 pg (ref 26.0–34.0)
MCHC: 32.1 g/dL (ref 30.0–36.0)
MCV: 89.5 fL (ref 80.0–100.0)
Monocytes Absolute: 0.7 10*3/uL (ref 0.1–1.0)
Monocytes Relative: 7 %
Neutro Abs: 5.5 10*3/uL (ref 1.7–7.7)
Neutrophils Relative %: 51 %
Platelets: 319 10*3/uL (ref 150–400)
RBC: 4.74 MIL/uL (ref 3.87–5.11)
RDW: 13.6 % (ref 11.5–15.5)
WBC: 10.7 10*3/uL — ABNORMAL HIGH (ref 4.0–10.5)
nRBC: 0 % (ref 0.0–0.2)

## 2018-06-04 LAB — COMPREHENSIVE METABOLIC PANEL
ALT: 21 U/L (ref 0–44)
AST: 20 U/L (ref 15–41)
Albumin: 3.6 g/dL (ref 3.5–5.0)
Alkaline Phosphatase: 64 U/L (ref 38–126)
Anion gap: 11 (ref 5–15)
BUN: 22 mg/dL — ABNORMAL HIGH (ref 6–20)
CO2: 24 mmol/L (ref 22–32)
Calcium: 8.9 mg/dL (ref 8.9–10.3)
Chloride: 98 mmol/L (ref 98–111)
Creatinine, Ser: 0.92 mg/dL (ref 0.44–1.00)
GFR calc Af Amer: >60 mL/min (ref >60)
GFR calc non Af Amer: >60 mL/min (ref >60)
Glucose, Bld: 157 mg/dL — ABNORMAL HIGH (ref 70–99)
Potassium: 4.3 mmol/L (ref 3.5–5.1)
Sodium: 133 mmol/L — ABNORMAL LOW (ref 135–145)
Total Bilirubin: 0.4 mg/dL (ref 0.3–1.2)
Total Protein: 6.8 g/dL (ref 6.5–8.1)

## 2018-06-04 LAB — I-STAT BETA HCG BLOOD, ED (MC, WL, AP ONLY): I-stat hCG, quantitative: <5 m[IU]/mL (ref <5)

## 2018-06-04 LAB — TROPONIN I
Troponin I: <0.03 ng/mL (ref <0.03)
Troponin I: <0.03 ng/mL (ref <0.03)

## 2018-06-04 LAB — LIPASE, BLOOD: Lipase: 48 U/L (ref 11–51)

## 2018-06-04 MED ORDER — PREDNISONE 20 MG PO TABS
60.0000 mg | ORAL_TABLET | Freq: Once | ORAL | Status: AC
  Administered 2018-06-04: 60 mg via ORAL
  Filled 2018-06-04: qty 3

## 2018-06-04 MED ORDER — MORPHINE SULFATE (PF) 4 MG/ML IV SOLN
4.0000 mg | Freq: Once | INTRAVENOUS | Status: AC
  Administered 2018-06-04: 4 mg via INTRAVENOUS
  Filled 2018-06-04: qty 1

## 2018-06-04 MED ORDER — ONDANSETRON HCL 4 MG/2ML IJ SOLN
4.0000 mg | Freq: Once | INTRAMUSCULAR | Status: AC
  Administered 2018-06-04: 4 mg via INTRAVENOUS
  Filled 2018-06-04: qty 2

## 2018-06-04 MED ORDER — ALBUTEROL SULFATE HFA 108 (90 BASE) MCG/ACT IN AERS
4.0000 | INHALATION_SPRAY | Freq: Once | RESPIRATORY_TRACT | Status: AC
  Administered 2018-06-04: 4 via RESPIRATORY_TRACT
  Filled 2018-06-04: qty 6.7

## 2018-06-04 MED ORDER — IOHEXOL 300 MG/ML  SOLN
100.0000 mL | Freq: Once | INTRAMUSCULAR | Status: AC | PRN
  Administered 2018-06-04: 100 mL via INTRAVENOUS

## 2018-06-04 NOTE — ED Notes (Signed)
Patient verbalizes understanding of discharge instructions. Opportunity for questioning and answers were provided. Armband removed by staff, pt discharged from ED. Pt ambulatory to lobby.  

## 2018-06-04 NOTE — ED Notes (Signed)
Second troponin clicked off on accident, RN to collect at 2128

## 2018-06-04 NOTE — ED Triage Notes (Signed)
Pt reports sharp chest pain that started 3 days ago, radiates to her right arm, also having some SOB. Denies any other symptoms. Pt a.o, ambulatory

## 2018-06-04 NOTE — ED Provider Notes (Signed)
[facility]   CSN: [id number] Arrival date & time: 06/04/18  1802    History   Chief Complaint Chief Complaint  Patient presents with  . Chest Pain  . Shortness of Breath    HPI Destiny Meyer is a 43 y.o. female.     HPI 43 yr old female with h/o CAD, HTN, DM, Asthma presents to the ED for 3 days of intermittent chest pain.  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  Pain is exertional and radiates down the right side of her body.  Denies any coughing, fevers, changes in bowel or bladder habits.  Appetite is been normal.  Complains of shortness of breath that accompanies the chest pain occasionally.  Reproducible by pushing on her chest.  Most recent catheterization was 2 years ago per patient report and and did not need any intervention at that time.  Denies any recent trauma to the chest. Past Medical History:  Diagnosis Date  . Asthma   . Depression   . Diabetes mellitus without complication (HCC)   . Hypertension     Patient Active Problem List   Diagnosis Date Noted  . Diabetes mellitus type 2, uncontrolled (HCC) 08/01/2012  . Essential hypertension, benign 08/01/2012  . Unspecified constipation 08/01/2012  . Nausea alone 07/30/2012  . Abdominal pain, unspecified site 07/30/2012    Past Surgical History:  Procedure Laterality Date  . ANKLE ARTHROSCOPY    . CHOLECYSTECTOMY    . HERNIA REPAIR    . KNEE ARTHROSCOPY    . SINUS EXPLORATION       OB History   No obstetric history on file.      Home Medications    Prior to Admission medications   Medication Sig Start Date End Date Taking? Authorizing Provider  amLODipine (NORVASC) 10 MG tablet Take 10 mg by mouth every morning.     [provider]  aspirin 81 MG tablet Take 81 mg by mouth every morning.     [provider]  clopidogrel (PLAVIX) 75 MG tablet Take 75 mg by mouth every morning.     [provider]   colesevelam (WELCHOL) 625 MG tablet Take 625 mg by mouth 2 (two) times daily with a meal.     [provider]  diphenhydrAMINE (BENADRYL) 25 MG tablet Take 1 tablet (25 mg total) by mouth every 6 (six) hours as needed for itching (Rash). 07/21/12   Piepenbrink, Victorino DikeJennifer, PA-C  enalapril-hydrochlorothiazide (VASERETIC) 10-25 MG per tablet Take 1 tablet by mouth every morning.     [provider]  gabapentin (NEURONTIN) 100 MG capsule Take 100 mg by mouth 3 (three) times daily.     [provider]  ibuprofen (ADVIL,MOTRIN) 200 MG tablet Take 400 mg by mouth every 6 (six) hours as needed for pain.     [provider]  insulin aspart protamine- aspart (NOVOLOG 70/30) (70-30) 100 UNIT/ML injection Inject 35 Units into the skin 2 (two) times daily.    [provider]  methocarbamol (ROBAXIN) 750 MG tablet Take 1,500 mg by mouth 3 (three) times daily.     [provider]  metoprolol tartrate (LOPRESSOR) 25 MG tablet Take 25 mg by mouth 2 (two) times daily.    [provider]  omeprazole (PRILOSEC) 40 MG capsule Take 40 mg by mouth every morning.     [provider]  ondansetron (ZOFRAN) 4 MG tablet Take 4 mg by mouth every  8 (eight) hours as needed for nausea.    [provider]  oxyCODONE-acetaminophen (PERCOCET/ROXICET) 5-325 MG per tablet Take 1 tablet by mouth every 8 (eight) hours as needed for pain. 08/03/12   Marinda Elk, MD  polyethylene glycol [facility] / Ethelene Hal) packet Take 17 g by mouth daily. 08/03/12   Marinda Elk, MD  pravastatin (PRAVACHOL) 10 MG tablet Take 10 mg by mouth every morning.     [provider]  QUEtiapine (SEROQUEL) 200 MG tablet Take 200 mg by mouth at bedtime.    [provider]  risperiDONE (RISPERDAL) 1 MG tablet Take 1 mg by mouth every morning.     [provider]    Family History Family History  Problem Relation Age of Onset  . COPD Mother    . Multiple sclerosis Mother     Social History Social History   Tobacco Use  . Smoking status: Former Smoker    Types: Cigarettes  . Smokeless tobacco: Never Used  Substance Use Topics  . Alcohol use: Yes    Comment: occ  . Drug use: No     Allergies   Eggs or egg-derived products; Levaquin [levofloxacin in d5w]; Codeine; Macrobid [nitrofurantoin macrocrystal]; and Penicillins   Review of Systems Review of Systems  Constitutional: Negative for activity change, chills, fatigue and fever.  HENT: Negative for ear pain and sore throat.   Eyes: Negative for pain and visual disturbance.  Respiratory: Positive for shortness of breath. Negative for cough.   Cardiovascular: Positive for chest pain. Negative for palpitations.  Gastrointestinal: Negative for abdominal pain and vomiting.  Genitourinary: Negative for dysuria and hematuria.  Musculoskeletal: Negative for arthralgias and back pain.  Skin: Negative for color change and rash.  Allergic/Immunologic: Negative for immunocompromised state.  Neurological: Negative for seizures and syncope.  All other systems reviewed and are negative.    Physical Exam Updated Vital Signs BP (!) 143/102   Pulse 79   Temp 98.1 F (36.7 C) (Oral)   Resp 16   SpO2 98%   Physical Exam Vitals signs and nursing note reviewed.  Constitutional:      Appearance: She is well-developed. She is not ill-appearing or diaphoretic.  HENT:     Head: Normocephalic and atraumatic.  Eyes:     Conjunctiva/sclera: Conjunctivae normal.  Neck:     Musculoskeletal: Neck supple.  Cardiovascular:     Rate and Rhythm: Regular rhythm.     Heart sounds: No murmur.  Pulmonary:     Effort: Pulmonary effort is normal. No respiratory distress.     Breath sounds: No decreased breath sounds or wheezing.  Chest:     Chest wall: Tenderness (minimal center TTP) present. No edema.  Abdominal:     Palpations: Abdomen is soft.     Tenderness: There is no  abdominal tenderness.  Musculoskeletal:     Right lower leg: No edema.     Left lower leg: No edema.  Skin:    General: Skin is warm and dry.  Neurological:     Mental Status: She is alert.      ED Treatments / Results  Labs (all labs ordered are listed, but only abnormal results are displayed) Labs Reviewed - No data to display  EKG None  Radiology No results found.  Procedures Procedures (including critical care time)  Medications Ordered in ED Medications - No data to display   Initial Impression / Assessment and Plan / ED Course  I have reviewed the  triage vital signs and the nursing notes.  Pertinent labs & imaging results that were available during my care of the patient were reviewed by me and considered in my medical decision making (see chart for details).        Patient presents to the emergency department for evaluation of chest pain for the last 3 days.  Nonspecific story.  Does have a history of coronary artery disease.  Repeat troponin testing normal.  No signs of pneumothorax.  CT scan looking for PE was negative and did not show any acute cardiopulmonary process to explain this patient's chest pain.  EKG appears similar to baseline.  Does not appear to be consistent with ACS at this time as the patient has had resolution of her symptoms while in the ED.  Possibly musculoskeletal given the reproducibility of tenderness to palpation.  The patient possibly does need a follow-up with PCP for stress testing however I do not believe it would benefit her to stay in the hospital overnight given her negative repeat troponin testing.  Patient in agreement with the plan and has had resolution of her symptoms and is stable for discharge at this time.  Told her return to the emergency department if any worrisome signs or symptoms appear.  Final Clinical Impressions(s) / ED Diagnoses   Final diagnoses:  Chest pain, unspecified type    ED Discharge Orders    None        Ina Kick, MD 06/04/18 4235    Gwyneth Sprout, MD 06/05/18 2105

## 2018-06-04 NOTE — ED Notes (Signed)
XR at bedside

## 2018-06-04 NOTE — ED Notes (Signed)
Patient transported to CT 

## 2018-06-04 NOTE — ED Notes (Signed)
Lab confirmed they have cmp/lipase/troponin specimen and are running. CT to come get patient upon labs resulting.

## 2018-06-04 NOTE — ED Notes (Signed)
ED Provider at bedside. 

## 2019-11-12 DEATH — deceased
